# Patient Record
Sex: Female | Born: 1970 | Race: Black or African American | Hispanic: No | Marital: Single | Smoking: Current every day smoker
Health system: Southern US, Community
[De-identification: ages and names within clinical notes are randomized; demographics above are authoritative.]

## PROBLEM LIST (undated history)

## (undated) DIAGNOSIS — E039 Hypothyroidism, unspecified: Secondary | ICD-10-CM

## (undated) DIAGNOSIS — IMO0001 Reserved for inherently not codable concepts without codable children: Secondary | ICD-10-CM

## (undated) HISTORY — PX: UTERINE FIBROID SURGERY: SHX826

---

## 2000-04-22 ENCOUNTER — Emergency Department (HOSPITAL_COMMUNITY): Admission: EM | Admit: 2000-04-22 | Discharge: 2000-04-23 | Payer: Self-pay | Admitting: Emergency Medicine

## 2000-07-08 ENCOUNTER — Emergency Department (HOSPITAL_COMMUNITY): Admission: EM | Admit: 2000-07-08 | Discharge: 2000-07-08 | Payer: Self-pay | Admitting: Emergency Medicine

## 2001-03-31 ENCOUNTER — Emergency Department (HOSPITAL_COMMUNITY): Admission: EM | Admit: 2001-03-31 | Discharge: 2001-03-31 | Payer: Self-pay | Admitting: *Deleted

## 2001-05-13 ENCOUNTER — Encounter: Payer: Self-pay | Admitting: Obstetrics & Gynecology

## 2001-05-13 ENCOUNTER — Inpatient Hospital Stay (HOSPITAL_COMMUNITY): Admission: EM | Admit: 2001-05-13 | Discharge: 2001-05-16 | Payer: Self-pay | Admitting: Emergency Medicine

## 2001-05-14 ENCOUNTER — Encounter: Payer: Self-pay | Admitting: Gastroenterology

## 2006-11-23 ENCOUNTER — Emergency Department (HOSPITAL_COMMUNITY): Admission: EM | Admit: 2006-11-23 | Discharge: 2006-11-23 | Payer: Self-pay | Admitting: Emergency Medicine

## 2006-11-25 ENCOUNTER — Inpatient Hospital Stay (HOSPITAL_COMMUNITY): Admission: AD | Admit: 2006-11-25 | Discharge: 2006-11-25 | Payer: Self-pay | Admitting: Obstetrics & Gynecology

## 2006-12-10 ENCOUNTER — Ambulatory Visit: Payer: Self-pay | Admitting: Obstetrics & Gynecology

## 2006-12-23 ENCOUNTER — Encounter (INDEPENDENT_AMBULATORY_CARE_PROVIDER_SITE_OTHER): Payer: Self-pay | Admitting: Gynecology

## 2006-12-23 ENCOUNTER — Ambulatory Visit: Payer: Self-pay | Admitting: Gynecology

## 2006-12-29 ENCOUNTER — Ambulatory Visit (HOSPITAL_COMMUNITY): Admission: RE | Admit: 2006-12-29 | Discharge: 2006-12-30 | Payer: Self-pay | Admitting: Gynecology

## 2006-12-29 ENCOUNTER — Encounter (INDEPENDENT_AMBULATORY_CARE_PROVIDER_SITE_OTHER): Payer: Self-pay | Admitting: Gynecology

## 2006-12-29 ENCOUNTER — Ambulatory Visit: Payer: Self-pay | Admitting: Gynecology

## 2007-01-27 ENCOUNTER — Ambulatory Visit: Payer: Self-pay | Admitting: Gynecology

## 2008-08-20 ENCOUNTER — Emergency Department (HOSPITAL_COMMUNITY): Admission: EM | Admit: 2008-08-20 | Discharge: 2008-08-20 | Payer: Self-pay | Admitting: Emergency Medicine

## 2009-08-22 ENCOUNTER — Emergency Department (HOSPITAL_COMMUNITY): Admission: EM | Admit: 2009-08-22 | Discharge: 2009-08-22 | Payer: Self-pay | Admitting: Emergency Medicine

## 2010-10-01 NOTE — Discharge Summary (Signed)
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   IN-HOSPITAL PROCEDURES:  Total vaginal hysterectomy, left salpingo-  oophorectomy with preservation of right tube and ovary.   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   HOSPITAL COURSE:  This patient is a 40 year old African American  multiparous female who underwent the aforementioned procedure on December 29, 2006. During the course of surgery was noted that the leiomyoma in  question which was approximately 7-8 cm was degenerated and demonstrated  material consistent with necrosis. Frozen section was unable to  demonstrate decisively whether the patient had a sarcoma or not.  The  entire uterus was submitted for frozen section. The left tube and ovary  were removed due to undue adherence to the left uterine sidewall. It was  determined to wait for final pathology report before proceeding with any  further management.   Postoperative hemoglobin was 9.9 from 11.3 preoperative, creatinine 0.6.  She was afebrile.  She had scant vaginal flow.  Abdomen: Soft.  Lungs:  Clear.  Calves without tenderness.   The patient was discharged with routine postoperative instructions  including contacting the office for temperature elevation above 100.4  degrees Fahrenheit, increasing abdominal pain, vaginal bleeding,  genitourinary or gastrointestinal symptomatology.  The patient was  requested to return to the GYN clinic in 4 weeks and Dilaudid 2 mg one  every 4-6 hours prescribed total number 30 for analgesia.   All questions  answered to the satisfaction of said patient and the patient verbalized  understanding of same.      Ginger Carne, MD  Electronically Signed     SHB/MEDQ  D:  12/30/2006  T:  12/30/2006  Job:  [id number]

## 2010-10-01 NOTE — H&P (Signed)
NAMENASHIYA, DISBROW            ACCOUNT NO.:  [id number]   MEDICAL RECORD NO.:  [id number]          PATIENT TYPE:  AMB   LOCATION:  SDC                           [facility]ACILITY:  [facility]   PHYSICIAN:  Ginger Carne, MD  DATE OF BIRTH:  March 14, 1971   DATE OF ADMISSION:  12/29/2006  DATE OF DISCHARGE:                              HISTORY & PHYSICAL   REASON FOR ADMISSION:  12 to 14-week leiomyomatous uterus.   HISTORY OF PRESENT ILLNESS:  This patient is a 40 year old gravida 6,  para 3-0-3-3 female with a 12 to 14-week leiomyomatous uterus on  physical examination and pelvic sonogram complaining of pelvic pain and  pressure.  Her menses are regular about every 28 days lasting for only 3-  4 days.  She has spotting between her menses but otherwise no specific  complaints.  The patient states the discomfort has worsened over the  past year.  The patient had no genitourinary, musculoskeletal or  gastrointestinal sources for her discomfort.  Transvaginal sonogram  obtained at the [facility] of [place] on November 25, 2006  demonstrates a retroverted uterus with a central submucosal fibroid  measuring 7 cm which has areas of degeneration.   OB/GYN HISTORY:  She has had three voluntary terminations of pregnancy  and three normal vaginal deliveries in the past.  Method of  contraception is tubal ligation.   ALLERGIES:  None.   CURRENT MEDICATIONS:  None.   PAST SURGICAL HISTORY:  None.   MEDICAL HISTORY:  None.   SOCIAL HISTORY:  The patient smokes about one pack of cigarettes daily.  Denies illicit alcohol or drug abuse.   REVIEW OF SYSTEMS:  14 point comprehensive review of systems within  normal limits.   FAMILY HISTORY:  Is negative for first-degree relatives with breast,  ovarian or uterine carcinoma.   PHYSICAL EXAMINATION:  VITAL SIGNS:  Blood pressure 96/67, weight 106  pounds, pulse 66 and regular, temperature 97.6.  HEENT: Grossly normal.  BREASTS:  Without masses,  discharge, thickenings or tenderness.  CHEST:  Clear to percussion and auscultation.  CARDIOVASCULAR:  Without murmur or enlargement.  Regular rate and  rhythm.  EXTREMITIES:  LYMPHATIC:  SKIN:  NEUROLOGICAL:  MUSCULOSKELETAL:  Systems within normal limits.  ABDOMEN:  Soft without gross hepatosplenomegaly, palpable suprapubic  mass consistent with said fibroid noted.  PELVIC:  Normal Pap smear.  External Genitalia: Vulva and vagina normal.  Cervix smooth without erosions or lesions.  Uterus is 14 weeks in size,  globular with a posterior palpated fibroid and retroverted and flexed.  Both adnexa palpable found to be normal.  Rectovaginal exam  confirmatory.   IMPRESSION:  Symptomatic leiomyomatous uterus.   PLAN:  The patient has no desire for further childbearing. Other options  including an uterine artery embolization were discussed and declined by  the patient as well as a myomectomy. Total vaginal hysterectomy with  preservation of both tubes or ovaries were discussed and understood and  the nature of said surgery understood by said patient.  Risks including  possible injuries to ureter, bowel, bladder, possible conversion to an  open procedure or  laparoscopic procedure, hemorrhage possibly requiring  blood transfusion, infection and other unforeseen complications were  discussed and understood by said patient.      Ginger Carne, MD  Electronically Signed     SHB/MEDQ  D:  12/28/2006  T:  12/28/2006  Job:  [id number]

## 2010-10-01 NOTE — Op Note (Signed)
NAMEADER, FRITZE            ACCOUNT NO.:  [id number]   MEDICAL RECORD NO.:  [id number]          PATIENT TYPE:  OIB   LOCATION:  9318                          FACILITY:  [facility]   PHYSICIAN:  Ginger Carne, MD  DATE OF BIRTH:  [birth year]   DATE OF PROCEDURE:  12/29/2006  DATE OF DISCHARGE:                               OPERATIVE REPORT   PREOPERATIVE DIAGNOSIS:  Degenerating leiomyoma, 12-14 weeks  leiomyomatous uterus.   POSTOPERATIVE DIAGNOSIS:  Degenerating leiomyoma, 12-14 weeks  leiomyomatous uterus.   PROCEDURE:  Total vaginal hysterectomy, left salpingo-oophorectomy with  preservation of right tube and ovary.   SURGEON:  Ginger Carne, M.D.   ASSISTANT:  Dr. Okey Dupre.   ESTIMATED BLOOD LOSS:  150 mL.   ANESTHESIA:  General.   COMPLICATIONS:  None immediate.   OPERATIVE FINDINGS:  Uterus was about 12-14 weeks in size. Upon wedging  the uterus it was noted that necrotic material was present.  The cervix  and the uterus were sent for frozen section and the pathologist was  unable to determine malignancy.  Therefore the patient was not subjected  to a laparotomy. The left tube and ovary were removed due to the  adherence to the lateral portion of the uterus without a adequately  developed utero-ovarian ligament.  The right tube and ovary appeared  normal.   OPERATIVE PROCEDURE:  The patient prepped and draped in usual fashion  and placed in lithotomy position.  Betadine solution used for antiseptic  and the patient was catheterized prior to procedure.  After adequate  general anesthesia, double tooth tenaculum placed on the anterior  posterior lips of the cervix.  Following this, Marcaine with epinephrine  was injected circumferentially around the cervix.  The anterior  posterior vaginal epithelium were incised transversely.  Peritoneal  reflections identified and opened without injury to their respective  organs.  Uterosacral cardinal ligament complexes clamped  and ligated  with 0 Vicryl suture and affixed to their respective lateral vaginal  walls. In the standard Richardson fashion uterine vasculature clamped  and ligated 0 Vicryl suture.  This extended to the ascending uterine  branches of the broad ligaments.  The right utero-ovarian ligament and  the left  infundibulopelvic ligaments were doubly clamped, cut and ligated 0  Vicryl suture.  Bleeding points hemostatically checked.  Blood clots  removed.  Closure of the cuff with 0 Vicryl running interlocking suture.  The patient tolerated the procedure well, returned to the post  anesthesia recovery room in excellent condition.      Ginger Carne, MD  Electronically Signed     SHB/MEDQ  D:  12/29/2006  T:  12/29/2006  Job:  [id number]

## 2010-10-04 NOTE — Discharge Summary (Signed)
[facility]  Patient:    Melinda Davidson, Melinda Davidson Visit Number: [id number] MRN: [id number]          Service Type: MED Location: 3W 0342 01 Attending Physician:  Judeth Cornfield Dictated by:   Sammuel Cooper, P.A.C. Admit Date:  05/13/2001 Discharge Date: 05/16/2001   CC:         Timothy P. Fontaine, M.D.   Discharge Summary  CHIEF COMPLAINT:  Acute abdominal pain and diarrhea.  HISTORY OF PRESENT ILLNESS:  Melinda Davidson is a 40 year old African-American female who was admitted unassigned through the ER with complaints of acute abdominal pain and diarrhea, which have been present over the past 48 hours.  The patient described severe crampy diffuse abdominal discomfort without nausea, vomiting, fever, or chills.  She also developed nonbloody diarrhea.  On the day of admission, she had noted a small amount of vaginal bleeding.  Her last menstrual period was completed approximately five days ago.  She was seen and evaluated in the ER and noted to have a white count of 19,000.  A CT scan of the abdomen and pelvis was ordered, showing diffuse thickening of the terminal ileum, ascending and transverse colon, and cecum, as well as mesenteric stranding.  GI was called.  The patient is admitted at this time for further diagnostic evaluation.  The patient has no prior history of GI disease, but mentions that she may have had an ulcer several years ago.  CURRENT MEDICATIONS:  None on a regular basis.  ALLERGIES:  No known drug allergies.  PAST MEDICAL HISTORY:  Pertinent for: 1. Bilateral tubal ligation. 2. History of peptic ulcer disease at age 17.  FAMILY HISTORY:  Negative for GI disease, specifically colon cancer and inflammatory bowel disease.  SOCIAL HISTORY:  The patient is single and unemployed.  She has two small children at home.  She is a smoker of half of a pack per day.  She uses alcohol socially on the weekends.  She denies any illicit drug  use.  REVIEW OF SYSTEMS:  Cardiovascular:  Denies any chest pain or anginal symptoms.  Pulmonary:  Negative for cough, shortness of breath, or sputum production.  Genitourinary:  Negative for dysuria, urgency, or frequency.  GI: As outlined above.  PHYSICAL EXAMINATION:  Per Barbette Hair. Arlyce Dice, M.D.  GENERAL APPEARANCE:  The patient is a well-developed, ill-appearing, young, African-American female.  Alert and oriented x 3.  VITAL SIGNS:  Temperature 101 degrees, pulse 102, and blood pressure 76/51 on admission.  HEENT:  Unremarkable.  CARDIOVASCULAR:  Regular rate and rhythm with S1 and S2.  She is tachycardic. No murmurs, rubs, or gallops.  CHEST:  Clear to A&P.  ABDOMEN:  Rather diffuse.  Mild tenderness with minimal guarding.  No rebound. No palpable organomegaly.  RECTAL:  Exam was deferred and was not done at the time of admission.  EXTREMITIES:  Without clubbing, cyanosis, or edema.  LABORATORY DATA:  On admission, WBC 19,000.  The remainder of the CBC is not located in the chart at the time of this dictation.  Follow-up on May 14, 2001, showed a WBC of 13,000, a hemoglobin of 10.4, a hematocrit of 31.0, an MCV of 84, and platelets of 202.  A follow-up on May 15, 2001, the WBC was 10.9, hemoglobin 9.3, hematocrit 28.0, and platelets 188.  Sodium on admission of 132, potassium 3.3, BUN 10, creatinine 0.8, glucose 102, and albumin 3.1.  Liver function studies normal.  The amylase was 21.  Follow-up electrolytes on  May 16, 2001, showed a sodium of 137, potassium 3.4, BUN 1, and creatinine 0.7.  Serum iron low at 26, TIBC low at 213, iron saturation low at 12, B12 level greater than 2000, RBC folate 251, ferritin 111.  Blood cultures x 2 were negative.  Stool for wbc was none seen. Stool for Clostridium difficile negative.  Stool culture negative.  X-RAY STUDIES:  Pelvic ultrasound on May 13, 2001, showed a retroflexed uterus with a rounded ring-like  left of midline, question fibroid or adenomyoma.  There was echogenic free fluid.  A CT scan of the abdomen and pelvis showed as outlined above, significantly thickened wall of the ascending and transverse colon with surrounding mesenteric stranding consistent with a colitis.  The appendix was seen with contrast or appendicolith and minimal stranding, but normal thickness.  ______ films were negative.  HOSPITAL COURSE:  The patient was admitted to the service on Barbette Hair. Arlyce Dice, M.D., who was covering the hospital.  She was placed on IV fluids at 150 cc/hr, started on IV Solu-Medrol for the possibility of an acute presentation of inflammatory bowel disease, covered with IV Pepcid, started on Asacol 1200 mg t.i.d., and given Demerol and Phenergan as needed for pain and nausea.  Stool cultures were ordered.  The following morning after discussion with the patient, she had mentioned that she had taken an antibiotic for four to five days about three weeks ago for a tooth abscess and had not had any other prior GI symptoms until abrupt onset.  She was noted to be febrile to 101.8 degrees.  The white count had gone from 19,000 to 13,000.  We felt that her abrupt presentation was more consistent with an infectious etiology.  We added Flagyl to her regimen and continued IV Cipro.  The patient also related that she had eaten [place] food with lobster the same day she became acute ill.  The decision was made to hold the Solu-Medrol and monitor her.  On May 15, 2001, her temperature was down and she felt subjectively improved.  We continued the same regimen.  By May 16, 2001, she was feeling significantly better.  Her diarrhea was resolving.  She was having less abdominal discomfort and not requiring any IV medication for pain.  She was afebrile.  Her white count has normalized.  She did have an anemia, which was more consistent with chronic disease than pure iron deficiency.  She also was  found to have positive gonococcus from her pelvic exam.   DISPOSITION:  It was felt that she was stable for discharge to home with plans for outpatient follow-up.  DISCHARGE MEDICATIONS: 1. Cipro 250 mg b.i.d. x 7 days. 2. Flagyl 250 mg q.i.d. x 7 days. 3. Extra Strength Tylenol one every four hours if needed for pain. 4. Levsin sublingual one or two every four hours if needed for pain and    cramping. 5. Full liquids and advance to soft foods with minimal lactose over the next    one to two weeks.  FOLLOW-UP:  She was to call Timothy P. Fontaine, M.D., for a GYN appointment for follow-up of her positive GC and for routine follow-up.  She was to call Molly Maduro D. Arlyce Dice, M.D., for a follow-up appointment in two to three weeks. Depending on the resolution of her symptoms in the interim will decide on need for colonoscopy as an outpatient at the time of her next office visit. Dictated by:   Sammuel Cooper, P.A.C. Attending Physician:  Arlyce Dice,  Debbe Bales DD:  05/25/01 TD:  05/25/01 Job: [id number]

## 2010-10-04 NOTE — H&P (Signed)
[facility]  Patient:    Melinda Davidson, Melinda Davidson Visit Number: [id number] MRN: [id number]          Service Type: MED Location: 3W 0342 01 Attending Physician:  Judeth Cornfield Dictated by:   Mike Gip, P.A.C. Admit Date:  05/13/2001 Discharge Date: 05/16/2001                           History and Physical  CHIEF COMPLAINT:  Abdominal pain and diarrhea.  HISTORY OF PRESENT ILLNESS:  Melinda Davidson is a 40 year old African-American female who is admitted through the ER unassigned with acute presentation of abdominal pain and diarrhea.  The patient said she had developed severe crampy, rather diffuse abdominal pain approximately 48 hours prior to presentation.  She had no associated nausea, vomiting, fever, or chills.  She did not note any melena or hematochezia but has had rather perfuse diarrhea.  The patient had also noted a small amount of vaginal bleeding on the day of admission.  She was seen and evaluated in the ER initially at [facility] and noted to have a white count of 19,000.  CT scan of the abdomen and pelvis was done showing diffuse thickening of the terminal ileum, ascending, transverse colon, and cecum with mesenteric stranding.  The patient was then transferred to the [facility] Emergency Room for GI evaluation and admission.  She was seen by Dr. Arlyce Dice and admitted for further diagnostic evaluation.  CURRENT MEDICATIONS:  None regular.  ALLERGIES:  No known drug allergies.  PAST MEDICAL HISTORY: 1. Status post bilateral tubal ligation. 2. History of peptic ulcer disease, age 40.  FAMILY HISTORY:  Negative for GI disease.  SOCIAL HISTORY:  The patient is a single mother of two small children, unemployed.  She is a smoker of one-half pack per day.  Social ETOH, no drug use.  REVIEW OF SYSTEMS:  Cardiovascular: Negative for chest pain or anginal symptoms.  Pulmonary: Negative for cough, shortness of breath, or sputum production.   Genitourinary: Negative for dysuria, urgency, or frequency.  GI: As above.  PHYSICAL EXAMINATION: (Per Dr. Arlyce Dice)  GENERAL:   The patient is a well-developed, ill-appearing, young African-American female, alert and oriented x 3.  VITAL SIGNS:  Temperature 101, pulse 102, blood pressure 76/51 on admission.  HEENT:  Unremarkable, anicteric.  CHEST: Clear to auscultation and percussion.  CARDIOVASCULAR:  Tachycardic, regular rhythm with S1 and S2.  ABDOMEN:  Soft with rather diffuse tenderness, minimal guarding.  No rebound or organomegaly.  RECTAL:  Not done on admission.  LABORATORY DATA:  From December 26, WBC was 19,000.  I do not have that hemoglobin available on this chart.  Sodium 132, potassium 3.3, BUN 10, creatinine 0.8, glucose 102, albumin 3.1.  Liver function studies normal.  She did have pelvic exam which returned with positive GC.  IMPRESSION: 39. A 40 year old female with acute abdominal pain and diarrhea with CT    findings of an acute right-sided colitis, rule out underlying inflammatory    bowel disease, rule out infectious etiology.  Doubt appendicitis. 2. Status post bilateral tubal ligation. 3. History of peptic ulcer disease.  PLAN: The patient was admitted to the service of Dr. Melvia Heaps for IV fluid hydration, pain control.  She was started on IV Solu-Medrol, covered with IV Cipro, started on Asacol empirically for possibility of an inflammatory bowel disease.  Stool for culture and sensitivity, WBC, and C. difficile were ordered as well as O&P.  She was to be n.p.o. initially, and depending on her course, would plan colonoscopy.Dictated by:   Mike Gip, P.A.C. Attending Physician:  Judeth Cornfield DD:  05/25/01 TD:  05/25/01 Job: [id number]

## 2010-10-04 NOTE — Discharge Summary (Signed)
[facility]  Patient:    MELLISSA, CONLEY Visit Number: [id number] MRN: [id number]          Service Type: MED Location: 3W 0342 01 Attending Physician:  Judeth Cornfield Dictated by:   Sammuel Cooper, P.A.-C. Admit Date:  05/13/2001 Discharge Date: 05/16/2001                             Discharge Summary  ADDENDUM TO DISCHARGE SUMMARY (MISTAKENLY DICTATED AS AN H&P)  ADMITTING DIAGNOSES: 49. A 40 year old African-American female with acute abdominal pain and    diarrhea with CT findings consistent with colitis, rule out infectious    versus underlying inflammatory bowel disease. 2. Status post bilateral tubal ligation. 3. History of peptic ulcer disease, age 54. 4. Mild normocytic anemia.  DISCHARGE DIAGNOSES: 1. Resolving inflammatory process of the right colon, rule out infectious    etiology versus inflammatory bowel disease.  Rapid resolution most    consistent with an infectious etiology. 2. Mild normocytic anemia secondary to above. 3. Positive GC probe/asymptomatic.  CONSULTATIONS:  None.  PROCEDURES:  CT scan of the abdomen and pelvis and pelvic ultrasound. Dictated by:   Sammuel Cooper, P.A.-C. Attending Physician:  Judeth Cornfield DD:  05/25/01 TD:  05/25/01 Job: [id number]

## 2011-03-03 LAB — CBC
HCT: 29.9 — ABNORMAL LOW
HCT: 37.2
Hemoglobin: 12.6
Hemoglobin: 9.9 — ABNORMAL LOW
MCHC: 32.9
MCV: 86.3
MCV: 86.6
Platelets: 212
Platelets: 249
RDW: 14.6 — ABNORMAL HIGH
WBC: 6.2

## 2011-03-03 LAB — BASIC METABOLIC PANEL
BUN: 2 — ABNORMAL LOW
BUN: 9
CO2: 29
Chloride: 105
GFR calc non Af Amer: >60
GFR calc non Af Amer: >60
Glucose, Bld: 113 — ABNORMAL HIGH
Glucose, Bld: 95
Potassium: 3.2 — ABNORMAL LOW
Potassium: 3.8
Sodium: 132 — ABNORMAL LOW
Sodium: 139

## 2011-03-04 LAB — CBC
HCT: 33.3 — ABNORMAL LOW
HCT: 34.3 — ABNORMAL LOW
MCHC: 34
MCHC: 32.9
MCV: 86.5
MCV: 85.9
Platelets: 251
RBC: 3.99
WBC: 10.5
WBC: 8.5

## 2011-03-04 LAB — DIFFERENTIAL
Basophils Relative: 0
Eosinophils Absolute: 0.1
Eosinophils Relative: 1
Lymphs Abs: 2.4
Monocytes Relative: 9
Neutrophils Relative %: 67

## 2011-03-04 LAB — URINALYSIS, ROUTINE W REFLEX MICROSCOPIC
Bilirubin Urine: NEGATIVE
Hgb urine dipstick: NEGATIVE
Ketones, ur: >80 — AB
Nitrite: NEGATIVE
Protein, ur: NEGATIVE
Specific Gravity, Urine: 1.026
Urobilinogen, UA: 1

## 2011-03-04 LAB — URINE MICROSCOPIC-ADD ON

## 2011-03-04 LAB — I-STAT 8, (EC8 V) (CONVERTED LAB)
BUN: 9
Bicarbonate: 25.3 — ABNORMAL HIGH
Glucose, Bld: 89
Operator id: 189501
TCO2: 27
pCO2, Ven: 42.1 — ABNORMAL LOW

## 2011-03-04 LAB — GC/CHLAMYDIA PROBE AMP, GENITAL
Chlamydia, DNA Probe: NEGATIVE
GC Probe Amp, Genital: NEGATIVE

## 2011-03-04 LAB — POCT PREGNANCY, URINE
Operator id: 189501
Preg Test, Ur: NEGATIVE

## 2011-03-04 LAB — WET PREP, GENITAL

## 2011-08-16 ENCOUNTER — Emergency Department (HOSPITAL_COMMUNITY)
Admission: EM | Admit: 2011-08-16 | Discharge: 2011-08-16 | Disposition: A | Discharge status: Home / Self Care | Payer: Self-pay | Attending: Emergency Medicine | Admitting: Emergency Medicine

## 2011-08-16 ENCOUNTER — Encounter (HOSPITAL_COMMUNITY): Payer: Self-pay | Admitting: Emergency Medicine

## 2011-08-16 ENCOUNTER — Emergency Department (HOSPITAL_COMMUNITY): Payer: Self-pay

## 2011-08-16 DIAGNOSIS — F17200 Nicotine dependence, unspecified, uncomplicated: Secondary | ICD-10-CM | POA: Insufficient documentation

## 2011-08-16 DIAGNOSIS — IMO0001 Reserved for inherently not codable concepts without codable children: Secondary | ICD-10-CM | POA: Insufficient documentation

## 2011-08-16 DIAGNOSIS — R51 Headache: Secondary | ICD-10-CM | POA: Insufficient documentation

## 2011-08-16 HISTORY — DX: Reserved for inherently not codable concepts without codable children: IMO0001

## 2011-08-16 MED ORDER — KETOROLAC TROMETHAMINE 30 MG/ML IJ SOLN
60.0000 mg | Freq: Once | INTRAMUSCULAR | Status: AC
  Administered 2011-08-16: 30 mg via INTRAMUSCULAR
  Filled 2011-08-16: qty 1

## 2011-08-16 MED ORDER — METOCLOPRAMIDE HCL 5 MG/ML IJ SOLN
10.0000 mg | Freq: Once | INTRAMUSCULAR | Status: AC
  Administered 2011-08-16: 10 mg via INTRAMUSCULAR
  Filled 2011-08-16: qty 2

## 2011-08-16 MED ORDER — DIPHENHYDRAMINE HCL 25 MG PO CAPS
25.0000 mg | ORAL_CAPSULE | Freq: Once | ORAL | Status: AC
  Administered 2011-08-16: 25 mg via ORAL
  Filled 2011-08-16: qty 1

## 2011-08-16 NOTE — ED Provider Notes (Signed)
History     CSN: [id number]  Arrival date & time 08/16/11  4098   First MD Initiated Contact with Patient 08/16/11 [phone]      Chief Complaint  Patient presents with  . Headache    (Consider location/radiation/quality/duration/timing/severity/associated sxs/prior treatment) HPI  Patient presents to emergency department complaining of a one-week history of intermittent diffuse headache followed by a one-week history of more constant left-sided headache. Patient states that she is a multiple year history of some recurrent headaches stating that she'll have a mild headache 2-3 times out of every month that she treats at home with over-the-counter pain medication with relief of symptoms. Patient states that starting 2 weeks ago she had intermittent diffuse headache that she treated with over-the-counter pain medication with good relief of symptoms. She denies associated fevers, chills, visual changes, neck stiffness, dizziness, difficulty ambulating. However over the last week patient states pain is become more unilateral with pain behind her left eye and in the left side of her head. Patient denies any pain within her actual eye and denies any visual changes. Patient denies associated photophobia or phonophobia. She denies blurred vision or visual changes. Patient states that she has been taking BC powders this week with some temporary improvement in pain but in pain will recur. Patient states that she wanted to be evaluated due to the increased frequency of headache being different than her history of more sporadic headaches. Patient states she's never been evaluated for headache in the past. She has no known medical problems and takes no medicines on regular basis. Once again she denies fevers, chills, dizziness, visual changes, neck stiffness, difficulty ambulating or loss of coordination, earache, nasal congestion, sinus pressure. Patient states pain is aggravated by movement stating that "if I drop  my head forward it makes the pain worse but if I raise up straight the pain improves."  Past Medical History  Diagnosis Date  . No significant past medical history     Past Surgical History  Procedure Date  . Uterine fibroid surgery     History reviewed. No pertinent family history.  History  Substance Use Topics  . Smoking status: Current Everyday Smoker -- 1.0 packs/day  . Smokeless tobacco: Not on file  . Alcohol Use: No    OB History    Grav Para Term Preterm Abortions TAB SAB Ect Mult Living                  Review of Systems  All other systems reviewed and are negative.    Allergies  Review of patient's allergies indicates no known allergies.  Home Medications  No current outpatient prescriptions on file.  BP 107/59  Pulse 78  Temp(Src) 97.9 F (36.6 C) (Oral)  Resp 20  SpO2 100%  Physical Exam  Nursing note and vitals reviewed. Constitutional: She is oriented to person, place, and time. She appears well-developed and well-nourished. No distress.  HENT:  Head: Normocephalic and atraumatic.  Right Ear: External ear normal.  Left Ear: External ear normal.  Nose: Nose normal.       No TTP of sinuses  Eyes: Conjunctivae and EOM are normal. Pupils are equal, round, and reactive to light.  Neck: Normal range of motion. Neck supple. No Brudzinski's sign and no Kernig's sign noted.  Cardiovascular: Normal rate, regular rhythm, normal heart sounds and intact distal pulses.  Exam reveals no gallop and no friction rub.   No murmur heard. Pulmonary/Chest: Effort normal and breath sounds normal. No  respiratory distress. She has no wheezes. She has no rales. She exhibits no tenderness.  Abdominal: Bowel sounds are normal. She exhibits no distension and no mass. There is no tenderness. There is no rebound and no guarding.  Musculoskeletal: Normal range of motion. She exhibits no edema and no tenderness.  Neurological: She is alert and oriented to person, place, and  time. No cranial nerve deficit. Coordination normal.  Skin: Skin is warm and dry. No rash noted. She is not diaphoretic. No erythema.  Psychiatric: She has a normal mood and affect.    ED Course  Procedures (including critical care time)  IM reglan and toradol. PO benadryl  Labs Reviewed - No data to display Ct Head Wo Contrast  08/16/2011  *RADIOLOGY REPORT*  Clinical Data: Headache  CT HEAD WITHOUT CONTRAST  Technique:  Contiguous axial images were obtained from the base of the skull through the vertex without contrast.  Comparison: None.  Findings: The ventricles are normal in size, shape, and position. There is no mass effect or midline shift.  No acute hemorrhage or abnormal extra-axial fluid collections are identified.  The gray/white differentiation is normal.  The orbits, calvarium, visualized paranasal sinuses have a normal appearance.  IMPRESSION: Normal CT scan of the head with no evidence of acute intracranial abnormality.  Original Report Authenticated By: Brandon Melnick, M.D.     1. Headache       MDM  Patient has multiple year history of recurrent headache with no acute findings on CT scan. She's afebrile nontoxic-appearing. No neurofocal findings. No ataxia. Patient ambulated without difficulty. No loss of coordination. Patient states pain is behind left eye but denies any eye pain or pressure and has no visual changes. Signs and symptoms are not consistent with sinusitis with her denying nasal congestion or tenderness to her sinus region. At this time we'll conservatively treat patient for headache and have her followup with neurologist for further evaluation and management of recurrent headaches however spoke at length with patient about changing or worsening of symptoms that would prompt return to ER. She voices her understanding and is agreeable plan.        Lenon Oms [place], [place] 08/16/11 [phone]

## 2011-08-16 NOTE — Discharge Instructions (Signed)
Continue to take some form of over the counter headache medication immediately at onset of headache. May alternate between acetaminophen type products and NSAIDs products such as ibuprofen/motrin/advil for pain relief. Follow up with Neurology in 1-2 weeks to discuss further evaluation and management of recurrent headaches but return to ER for changing or worsening of symptoms.   Headaches, Frequently Asked Questions MIGRAINE HEADACHES Q: What is migraine? What causes it? How can I treat it? A: Generally, migraine headaches begin as a dull ache. Then they develop into a constant, throbbing, and pulsating pain. You may experience pain at the temples. You may experience pain at the front or back of one or both sides of the head. The pain is usually accompanied by a combination of:  Nausea.   Vomiting.   Sensitivity to light and noise.  Some people (about 15%) experience an aura (see below) before an attack. The cause of migraine is believed to be chemical reactions in the brain. Treatment for migraine may include over-the-counter or prescription medications. It may also include self-help techniques. These include relaxation training and biofeedback.  Q: What is an aura? A: About 15% of people with migraine get an "aura". This is a sign of neurological symptoms that occur before a migraine headache. You may see wavy or jagged lines, dots, or flashing lights. You might experience tunnel vision or blind spots in one or both eyes. The aura can include visual or auditory hallucinations (something imagined). It may include disruptions in smell (such as strange odors), taste or touch. Other symptoms include:  Numbness.   A "pins and needles" sensation.   Difficulty in recalling or speaking the correct word.  These neurological events may last as long as 60 minutes. These symptoms will fade as the headache begins. Q: What is a trigger? A: Certain physical or environmental factors can lead to or  "trigger" a migraine. These include:  Foods.   Hormonal changes.   Weather.   Stress.  It is important to remember that triggers are different for everyone. To help prevent migraine attacks, you need to figure out which triggers affect you. Keep a headache diary. This is a good way to track triggers. The diary will help you talk to your healthcare professional about your condition. Q: Does weather affect migraines? A: Bright sunshine, hot, humid conditions, and drastic changes in barometric pressure may lead to, or "trigger," a migraine attack in some people. But studies have shown that weather does not act as a trigger for everyone with migraines. Q: What is the link between migraine and hormones? A: Hormones start and regulate many of your body's functions. Hormones keep your body in balance within a constantly changing environment. The levels of hormones in your body are unbalanced at times. Examples are during menstruation, pregnancy, or menopause. That can lead to a migraine attack. In fact, about three quarters of all women with migraine report that their attacks are related to the menstrual cycle.  Q: Is there an increased risk of stroke for migraine sufferers? A: The likelihood of a migraine attack causing a stroke is very remote. That is not to say that migraine sufferers cannot have a stroke associated with their migraines. In persons under age 41, the most common associated factor for stroke is migraine headache. But over the course of a person's normal life span, the occurrence of migraine headache may actually be associated with a reduced risk of dying from cerebrovascular disease due to stroke.  Q: What are acute  medications for migraine? A: Acute medications are used to treat the pain of the headache after it has started. Examples over-the-counter medications, NSAIDs, ergots, and triptans.  Q: What are the triptans? A: Triptans are the newest class of abortive medications. They are  specifically targeted to treat migraine. Triptans are vasoconstrictors. They moderate some chemical reactions in the brain. The triptans work on receptors in your brain. Triptans help to restore the balance of a neurotransmitter called serotonin. Fluctuations in levels of serotonin are thought to be a main cause of migraine.  Q: Are over-the-counter medications for migraine effective? A: Over-the-counter, or "OTC," medications may be effective in relieving mild to moderate pain and associated symptoms of migraine. But you should see your caregiver before beginning any treatment regimen for migraine.  Q: What are preventive medications for migraine? A: Preventive medications for migraine are sometimes referred to as "prophylactic" treatments. They are used to reduce the frequency, severity, and length of migraine attacks. Examples of preventive medications include antiepileptic medications, antidepressants, beta-blockers, calcium channel blockers, and NSAIDs (nonsteroidal anti-inflammatory drugs). Q: Why are anticonvulsants used to treat migraine? A: During the past few years, there has been an increased interest in antiepileptic drugs for the prevention of migraine. They are sometimes referred to as "anticonvulsants". Both epilepsy and migraine may be caused by similar reactions in the brain.  Q: Why are antidepressants used to treat migraine? A: Antidepressants are typically used to treat people with depression. They may reduce migraine frequency by regulating chemical levels, such as serotonin, in the brain.  Q: What alternative therapies are used to treat migraine? A: The term "alternative therapies" is often used to describe treatments considered outside the scope of conventional Western medicine. Examples of alternative therapy include acupuncture, acupressure, and yoga. Another common alternative treatment is herbal therapy. Some herbs are believed to relieve headache pain. Always discuss alternative  therapies with your caregiver before proceeding. Some herbal products contain arsenic and other toxins. TENSION HEADACHES Q: What is a tension-type headache? What causes it? How can I treat it? A: Tension-type headaches occur randomly. They are often the result of temporary stress, anxiety, fatigue, or anger. Symptoms include soreness in your temples, a tightening band-like sensation around your head (a "vice-like" ache). Symptoms can also include a pulling feeling, pressure sensations, and contracting head and neck muscles. The headache begins in your forehead, temples, or the back of your head and neck. Treatment for tension-type headache may include over-the-counter or prescription medications. Treatment may also include self-help techniques such as relaxation training and biofeedback. CLUSTER HEADACHES Q: What is a cluster headache? What causes it? How can I treat it? A: Cluster headache gets its name because the attacks come in groups. The pain arrives with little, if any, warning. It is usually on one side of the head. A tearing or bloodshot eye and a runny nose on the same side of the headache may also accompany the pain. Cluster headaches are believed to be caused by chemical reactions in the brain. They have been described as the most severe and intense of any headache type. Treatment for cluster headache includes prescription medication and oxygen. SINUS HEADACHES Q: What is a sinus headache? What causes it? How can I treat it? A: When a cavity in the bones of the face and skull (a sinus) becomes inflamed, the inflammation will cause localized pain. This condition is usually the result of an allergic reaction, a tumor, or an infection. If your headache is caused by a sinus  blockage, such as an infection, you will probably have a fever. An x-ray will confirm a sinus blockage. Your caregiver's treatment might include antibiotics for the infection, as well as antihistamines or decongestants.    REBOUND HEADACHES Q: What is a rebound headache? What causes it? How can I treat it? A: A pattern of taking acute headache medications too often can lead to a condition known as "rebound headache." A pattern of taking too much headache medication includes taking it more than 2 days per week or in excessive amounts. That means more than the label or a caregiver advises. With rebound headaches, your medications not only stop relieving pain, they actually begin to cause headaches. Doctors treat rebound headache by tapering the medication that is being overused. Sometimes your caregiver will gradually substitute a different type of treatment or medication. Stopping may be a challenge. Regularly overusing a medication increases the potential for serious side effects. Consult a caregiver if you regularly use headache medications more than 2 days per week or more than the label advises. ADDITIONAL QUESTIONS AND ANSWERS Q: What is biofeedback? A: Biofeedback is a self-help treatment. Biofeedback uses special equipment to monitor your body's involuntary physical responses. Biofeedback monitors:  Breathing.   Pulse.   Heart rate.   Temperature.   Muscle tension.   Brain activity.  Biofeedback helps you refine and perfect your relaxation exercises. You learn to control the physical responses that are related to stress. Once the technique has been mastered, you do not need the equipment any more. Q: Are headaches hereditary? A: Four out of five (80%) of people that suffer report a family history of migraine. Scientists are not sure if this is genetic or a family predisposition. Despite the uncertainty, a child has a 50% chance of having migraine if one parent suffers. The child has a 75% chance if both parents suffer.  Q: Can children get headaches? A: By the time they reach high school, most young people have experienced some type of headache. Many safe and effective approaches or medications can  prevent a headache from occurring or stop it after it has begun.  Q: What type of doctor should I see to diagnose and treat my headache? A: Start with your primary caregiver. Discuss his or her experience and approach to headaches. Discuss methods of classification, diagnosis, and treatment. Your caregiver may decide to recommend you to a headache specialist, depending upon your symptoms or other physical conditions. Having diabetes, allergies, etc., may require a more comprehensive and inclusive approach to your headache. The National Head[employer] Foundation will provide, upon request, a list of [facility] physician members in your state. Document Released: 07/26/2003 Document Revised: 04/24/2011 Document Reviewed: 01/03/2008 [facility] Patient Information 2012 [place], [place].

## 2011-08-16 NOTE — ED Notes (Signed)
Patient complaining of headache on her left side around here eye; describes pain as "pressure".  Patient states that she gets headaches on and off over the past few years, but that this current headache has been constant for the past week.  Patient states that she has been taking BC's, which will bring the pain down, but that it will come back after the medication wears off.  Patient denies blurred vision and light sensitivity.

## 2011-08-17 NOTE — ED Provider Notes (Signed)
Medical screening examination/treatment/procedure(s) were performed by non-physician practitioner and as supervising physician I was immediately available for consultation/collaboration.  Toy Baker, MD 08/17/11 [phone]

## 2011-10-08 ENCOUNTER — Encounter (HOSPITAL_COMMUNITY): Payer: Self-pay | Admitting: Emergency Medicine

## 2011-10-08 ENCOUNTER — Emergency Department (HOSPITAL_COMMUNITY)
Admission: EM | Admit: 2011-10-08 | Discharge: 2011-10-08 | Disposition: A | Discharge status: Home / Self Care | Payer: Self-pay | Attending: Emergency Medicine | Admitting: Emergency Medicine

## 2011-10-08 DIAGNOSIS — L723 Sebaceous cyst: Secondary | ICD-10-CM | POA: Insufficient documentation

## 2011-10-08 DIAGNOSIS — L089 Local infection of the skin and subcutaneous tissue, unspecified: Secondary | ICD-10-CM

## 2011-10-08 NOTE — ED Notes (Signed)
PA-C in room at this time  

## 2011-10-08 NOTE — ED Provider Notes (Signed)
Medical screening examination/treatment/procedure(s) were performed by non-physician practitioner and as supervising physician I was immediately available for consultation/collaboration.   Benny Lennert, MD 10/08/11 [phone]

## 2011-10-08 NOTE — ED Notes (Signed)
Pt c/o pain under right arm in axillary area, Red swollen area size of dime. Pt states it has been swollen for a "couple of months" but in the last week has started to hurt.

## 2011-10-08 NOTE — ED Notes (Signed)
D/C inst given, pt verbalized understanding. NAD noted at time of d/c home.

## 2011-10-08 NOTE — ED Provider Notes (Signed)
History     CSN: [id number]  Arrival date & time 10/08/11  4098   First MD Initiated Contact with Patient 10/08/11 [phone]      Chief Complaint  Patient presents with  . Arm Pain    (Consider location/radiation/quality/duration/timing/severity/associated sxs/prior treatment) HPI  Patient presents to emergency department complaining of a couple month history of a "bump" in right axilla region. Our patient states that over the last week the area has become red and painful to touch. Patient denies any radiation of erythema, fevers, chills, drainage from lesion. She has taken nothing for pain PTA. Pain aggravated by touch and movement of arm at axilla. Improved with keeping arm still.   Past Medical History  Diagnosis Date  . No significant past medical history     Past Surgical History  Procedure Date  . Uterine fibroid surgery     No family history on file.  History  Substance Use Topics  . Smoking status: Current Everyday Smoker -- 1.0 packs/day  . Smokeless tobacco: Not on file  . Alcohol Use: No    OB History    Grav Para Term Preterm Abortions TAB SAB Ect Mult Living                  Review of Systems  All other systems reviewed and are negative.    Allergies  Review of patient's allergies indicates no known allergies.  Home Medications   Current Outpatient Rx  Name Route Sig Dispense Refill  . ACETAMINOPHEN 500 MG PO TABS Oral Take 1,000 mg by mouth every 6 (six) hours as needed. For pain      BP 102/69  Pulse 75  Temp(Src) 98.2 F (36.8 C) (Oral)  Resp 14  SpO2 98%  Physical Exam  Nursing note and vitals reviewed. Constitutional: She is oriented to person, place, and time. She appears well-developed and well-nourished. No distress.  HENT:  Head: Normocephalic and atraumatic.  Eyes: Conjunctivae are normal.  Cardiovascular: Normal rate, regular rhythm, normal heart sounds and intact distal pulses.  Exam reveals no gallop and no friction rub.     No murmur heard. Pulmonary/Chest: Effort normal and breath sounds normal. No respiratory distress. She has no wheezes. She has no rales. She exhibits no tenderness.  Musculoskeletal: Normal range of motion. She exhibits no edema and no tenderness.  Neurological: She is alert and oriented to person, place, and time.  Skin: Skin is warm and dry. No rash noted. She is not diaphoretic. There is erythema.       Pea sized area of induration with TTP with faint erythema.   Psychiatric: She has a normal mood and affect.    ED Course  Procedures (including critical care time)  INCISION AND DRAINAGE Performed by: Drucie Opitz Consent: Verbal consent obtained. Risks and benefits: risks, benefits and alternatives were discussed Type: abscess  Body area: right axilla.   Anesthesia: local infiltration  Local anesthetic: lidocaine 2% with epinephrine  Anesthetic total: 6 ml  Complexity: complex Blunt dissection to break up loculations  Drainage: purulent, cystic sold material   Drainage amount: scant   Patient tolerance: Patient tolerated the procedure well with no immediate complications.     Labs Reviewed - No data to display No results found.   1. Infected epithelial inclusion cyst       MDM  Infected epidermal cyst without cellulitis. No axillary LAD.         [place], [place] 10/08/11 [phone]

## 2011-10-08 NOTE — Discharge Instructions (Signed)
Keep area clean with mild soap and water and covered with band-aid. Monitor for infection. Alternate between tylenol and ibuprofen as needed for pain. Return to ER for emergent changing or worsening of symptoms. Otherwise establishment with a Primary Care provider is Very important for general health care concerns, minor illness and minor injury.   Epidermal Cyst An epidermal cyst is sometimes called a sebaceous cyst, epidermal inclusion cyst, or infundibular cyst. These cysts usually contain a substance that looks "pasty" or "cheesy" and may have a bad smell. This substance is a protein called keratin. Epidermal cysts are usually found on the face, neck, or trunk. They may also occur in the vaginal area or other parts of the genitalia of both men and women. Epidermal cysts are usually small, painless, slow-growing bumps or lumps that move freely under the skin. It is important not to try to pop them. This may cause an infection and lead to tenderness and swelling. CAUSES  Epidermal cysts may be caused by a deep penetrating injury to the skin or a plugged hair follicle, often associated with acne. SYMPTOMS  Epidermal cysts can become inflamed and cause:  Redness.   Tenderness.   Increased temperature of the skin over the bumps or lumps.   Grayish-white, bad smelling material that drains from the bump or lump.  DIAGNOSIS  Epidermal cysts are easily diagnosed by your caregiver during an exam. Rarely, a tissue sample (biopsy) may be taken to rule out other conditions that may resemble epidermal cysts. TREATMENT   Epidermal cysts often get better and disappear on their own. They are rarely ever cancerous.   If a cyst becomes infected, it may become inflamed and tender. This may require opening and draining the cyst. Treatment with antibiotics may be necessary. When the infection is gone, the cyst may be removed with minor surgery.   Small, inflamed cysts can often be treated with antibiotics or  by injecting steroid medicines.   Sometimes, epidermal cysts become large and bothersome. If this happens, surgical removal in your caregiver's office may be necessary.  HOME CARE INSTRUCTIONS  Only take over-the-counter or prescription medicines as directed by your caregiver.   Take your antibiotics as directed. Finish them even if you start to feel better.  SEEK MEDICAL CARE IF:   Your cyst becomes tender, red, or swollen.   Your condition is not improving or is getting worse.   You have any other questions or concerns.  MAKE SURE YOU:  Understand these instructions.   Will watch your condition.   Will get help right away if you are not doing well or get worse.  Document Released: 04/05/2004 Document Revised: 04/24/2011 Document Reviewed: 11/11/2010 [facility] Patient Information 2012 [place], [place].

## 2014-05-18 ENCOUNTER — Emergency Department (HOSPITAL_COMMUNITY): Payer: No Typology Code available for payment source

## 2014-05-18 ENCOUNTER — Encounter (HOSPITAL_COMMUNITY): Payer: Self-pay | Admitting: Emergency Medicine

## 2014-05-18 ENCOUNTER — Emergency Department (HOSPITAL_COMMUNITY)
Admission: EM | Admit: 2014-05-18 | Discharge: 2014-05-19 | Disposition: A | Discharge status: Home / Self Care | Payer: No Typology Code available for payment source | Attending: Emergency Medicine | Admitting: Emergency Medicine

## 2014-05-18 DIAGNOSIS — S8991XA Unspecified injury of right lower leg, initial encounter: Secondary | ICD-10-CM | POA: Insufficient documentation

## 2014-05-18 DIAGNOSIS — Y998 Other external cause status: Secondary | ICD-10-CM | POA: Insufficient documentation

## 2014-05-18 DIAGNOSIS — Y9389 Activity, other specified: Secondary | ICD-10-CM | POA: Insufficient documentation

## 2014-05-18 DIAGNOSIS — M25561 Pain in right knee: Secondary | ICD-10-CM

## 2014-05-18 DIAGNOSIS — S4991XA Unspecified injury of right shoulder and upper arm, initial encounter: Secondary | ICD-10-CM | POA: Diagnosis not present

## 2014-05-18 DIAGNOSIS — M25511 Pain in right shoulder: Secondary | ICD-10-CM

## 2014-05-18 DIAGNOSIS — Z720 Tobacco use: Secondary | ICD-10-CM | POA: Insufficient documentation

## 2014-05-18 DIAGNOSIS — Y92410 Unspecified street and highway as the place of occurrence of the external cause: Secondary | ICD-10-CM | POA: Insufficient documentation

## 2014-05-18 MED ORDER — OXYCODONE-ACETAMINOPHEN 5-325 MG PO TABS
1.0000 | ORAL_TABLET | Freq: Once | ORAL | Status: AC
  Administered 2014-05-18: 1 via ORAL
  Filled 2014-05-18: qty 1

## 2014-05-18 NOTE — ED Notes (Signed)
Patient transported to X-ray 
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
## 2014-05-18 NOTE — Discharge Instructions (Signed)
°Emergency Department Resource Guide °1) Find a Doctor and Pay Out of Pocket °Although you won't have to find out who is covered by your insurance plan, it is a good idea to ask around and get recommendations. You will then need to call the office and see if the doctor you have chosen will accept you as a new patient and what types of options they offer for patients who are self-pay. Some doctors offer discounts or will set up payment plans for their patients who do not have insurance, but you will need to ask so you aren't surprised when you get to your appointment. ° °2) Contact Your Local Health Department °Not all health departments have doctors that can see patients for sick visits, but many do, so it is worth a call to see if yours does. If you don't know where your local health department is, you can check in your phone book. The CDC also has a tool to help you locate your state's health department, and many state websites also have listings of all of their local health departments. ° °3) Find a Walk-in Clinic °If your illness is not likely to be very severe or complicated, you may want to try a walk in clinic. These are popping up all over the country in pharmacies, drugstores, and shopping centers. They're usually staffed by nurse practitioners or physician assistants that have been trained to treat common illnesses and complaints. They're usually fairly quick and inexpensive. However, if you have serious medical issues or chronic medical problems, these are probably not your best option. ° °No Primary Care Doctor: °- Call [facility] at  832-8000 - they can help you locate a primary care doctor that  accepts your insurance, provides certain services, etc. °- Physician Referral Service- 1-800-533-3463 ° °Chronic Pain Problems: °Organization         Address  Phone   Notes  °[facility]  (336) 297-2271 Patients need to be referred by their primary care doctor.  ° °Medication  Assistance: °Organization         Address  Phone   Notes  °[place] [facility] [address] °[place], [place] [address] (336) 641-8030 --Must be a resident of [place] °-- Must have NO insurance coverage whatsoever (no Medicaid/ Medicare, etc.) °-- The pt. MUST have a primary care doctor that directs their care regularly and follows them in the community °  °[facility]  (866) 331-1348   °[employer]  (888) 892-1162   ° °Agencies that provide inexpensive medical care: °Organization         Address  Phone   Notes  °[facility]  (336) 832-8035   °[facility]    (336) 832-7272   °[facility] [address] °[place],  [address] (336) 832-4777   °[facility] [address], °[place] (336) 271-4999   °[facility]    (336) 373-0678   °[facility]    (336) 272-1050   °[facility] ° [address], [place] Phone:  (336) 832-4444, Fax:  (336) 832-4440 Hours of Operation:  9 am - 6 pm, M-F.  Also accepts Medicaid/Medicare and self-pay.  °[facility] ° [address], [place] Phone: (336) 832-3150, Fax: (336) [phone]. Hours of Operation:  8:30 am - 5:30 pm, M-F.  Also accepts Medicaid and self-pay.  °[facility] [place] [address], [place] Phone: (336) 878-6027   °[facility] [address], [place], [place] (336)723-1848, Ext. 123 Mondays & Thursdays: 7-9 AM.  First 15 patients are seen on a first come, first serve basis. °  ° °Medicaid-accepting [place] Providers: ° °Organization         Address  Phone   Notes  °[facility] [address], [place] (336) 641-2100 Also accepts self-pay patients.  °[facility] [address], [place] ° (336) 856-9996   °[facility] [address], [place]  (336) 288-8857   °[facility] [address] [place] [address], [place] (336) 299-7000   °Veita Bland [address], [place]  ° (336) 373-1557 Only accepts [place] Access Medicaid patients after they have their name applied to their card.  ° °Self-Pay (no insurance) in [place]: ° °Organization         Address  Phone   Notes  °Sickle Cell Patients, [facility] [address], [place] (336) 832-1970   °[facility] [address], [place] (336) 832-4400   °[facility] ° [address], [place] (336) 992-4800   °[facility]/Dr. Osei-Bonsu ° [address] [place] [address], [place] or [address], [place] (336) 841-8500 Phone number for both [place] and [place] locations is the same.  °[facility] [address], [place] (336) 299-0000   °[facility] [address] [place] [address], [place] or [address] (336) 852-7530 °(336) 878-2260   °[facility] [address], [place] (336) 350-1642, phone; (336) 294-5005, fax Sees patients 1st and 3rd Saturday of every month.  Must not qualify for public or private insurance (i.e. Medicaid, Medicare, [place] Health Choice, Veterans' Benefits) • Household income should be no more than 200% of the poverty level •The clinic cannot treat you if you are pregnant or think you are pregnant • Sexually transmitted diseases are not treated at the clinic.  ° ° °Dental Care: °Organization         Address  Phone  Notes  °[place] [facility] [address], [place] (336) 641-6152 Accepts children up to age 21 who are enrolled in Medicaid or [place] Health Choice; pregnant women with a Medicaid card; and children who have applied for Medicaid or [place] Health Choice, but were declined, whose parents can pay a reduced fee at time of service.  °[place]  [facility] [place]  [address], [place] (336) 641-7733 Accepts children up to age 21 who are enrolled in Medicaid or [place] Health Choice; pregnant women with a Medicaid card; and children who have applied for Medicaid or  Health Choice, but were declined, whose parents can pay a reduced fee at time of service.  °[facility] ° [address],  (336) 641-4533 Patients are seen by appointment only. Walk-ins are not accepted. [facility] will see patients 18 years of age and older. °Monday - Tuesday (8am-5pm) °Most Wednesdays (8:30-5pm) °$30 per visit, cash only  °[facility] ° [address], [place] (336) 641-4533 Patients are seen by appointment only. Walk-ins are not accepted. [facility] will see patients 18 years of age and older. °One   Wednesday Evening (Monthly: Volunteer Based).  $30 per visit, cash only  °[facility]  (919) 537-3737 for adults; Children under age 4, call Graduate Pediatric Dentistry at (919) [phone]. Children aged 4-14, please call (919) [phone] to request a pediatric application. ° Dental services are provided in all areas of dental care including fillings, crowns and bridges, complete and partial dentures, implants, gum treatment, root canals, and extractions. Preventive care is also provided. Treatment is provided to both adults and children. °Patients are selected via a lottery and there is often a waiting list. °  °[facility] [address], °[place] ° (336) 763-8833 [email or web address] °  °[facility] [address], [place], [place] (336)723-1848, Ext. 123 Second and Fourth Thursday of each month, opens at 6:30 AM; Clinic ends at 9 AM.  Patients are seen on a first-come first-served basis, and a limited number are seen during each clinic.  ° °[facility] ° [address], [place], [place] (336) 723-7904    Eligibility Requirements °You must have lived in [place], [place], or [place] counties for at least the last three months. °  You cannot be eligible for state or federal sponsored healthcare insurance, including Veterans Administration, Medicaid, or Medicare. °  You generally cannot be eligible for healthcare insurance through your employer.  °  How to apply: °Eligibility screenings are held every Tuesday and Wednesday afternoon from 1:00 pm until 4:00 pm. You do not need an appointment for the interview!  °[facility] [address], [place], [place] 336-631-2330   °[facility]  336-342-8273   °[facility]  336-703-3100   °[facility]  336-570-6415   ° °Behavioral Health Resources in the Community: °Intensive Outpatient Programs °Organization         Address  Phone  Notes  °[facility] [address], [place], [place] 336-878-6098   °[facility] [address], [place], [place] 336-832-9800   °[facility] [address], [place], [place] ° 336-882-2125   °[facility] [address],  °[place], [place] 1-800-853-5163 or 336-641-4981   °Substance Abuse Resources °Organization         Address  Phone  Notes  °[facility]  336-882-2125   °[facility]  336-784-9470   °[facility]  336-285-9073   °[facility]  336-845-3988   °Residential & Outpatient Substance Abuse Program  1-800-659-3381   °Psychological Services °Organization         Address  Phone  Notes  °[facility]  336- 832-9600   °[facility]  336- 378-7881   °[facility] [address], [place] 1-800-853-5163 or 336-641-4981   ° °Mobile Crisis Teams °Organization         Address  Phone  Notes  °[facility], Mobile Crisis Care Unit  1-877-626-1772   °Assertive °[facility] ° [address]  [place], [place] 336-834-9664   °Sharon DeEsch [address] °[place] 336-554-5454   ° °Self-Help/Support Groups °Organization         Address  Phone             Notes  °[facility] [place] - variety of support groups  336- 373-1402 Call for more information  °Narcotics Anonymous (NA), [facility] [address], °[place]  2 meetings at this location  ° °  Residential Treatment Programs °Organization         Address  Phone  Notes  °[facility] [address],    °[place]  1-866-801-8205   °[facility] ° [address], [place], [place] 704-293-8524   °[facility] [address], [place] 336-845-3988 Admissions: 8am-3pm M-F  °[facility] [address]    °[place], [place] 336-841-1104   °[facility] [address], [place], [place] 336-379-7146   °[facility] [address]  °[place], [place] 336-285-9073   °[facility] - Intensive Outpatient [address], [place], [place] 336-852-3033   °[facility] [address]  °[place], [place] 1-877-615-2722 or 336-784-9470   °[facility] [address] [place], [place] 336-227-7417 Accepts Medicaid  °[facility] [address]  °[place] 1-800-659-3381 Substance Abuse/Addiction Treatment  ° °[place] Behavioral Health Resources °Organization         Address  Phone  Notes  °[facility]  (888) 581-9988   °Julie Brannon, PhD [address] [place], [place]   (336) 349-5553 or (336) 951-0000   °[facility]   [address] °[place], [place] (336) 349-4454   °[facility] [address], [place], [place] (336) 342-8316 Insurance/Medicaid/sponsorship through [facility]  °[facility] [address]                                    [place], [place] (336) 342-8316 Therapy/tele-psych/case    °[facility] [address]  ° [place], [place] (336) 349-2233    °Dr. Arfeen  (336) 349-4544   °[facility] [place]  [employer] [place] [facility] 1) [address], [place] °2) [address], [place] °3)  [address], [place] (336) 349-3220 °(336) 342-7768 ° °(336) 342-8140   °[place] Child Abuse [facility] (336) 342-1394 or (336) 342-3537 (After Hours)    ° ° °

## 2014-05-22 ENCOUNTER — Encounter (HOSPITAL_COMMUNITY): Payer: Self-pay | Admitting: Cardiology

## 2014-05-22 ENCOUNTER — Emergency Department (HOSPITAL_COMMUNITY)
Admission: EM | Admit: 2014-05-22 | Discharge: 2014-05-22 | Disposition: A | Discharge status: Home / Self Care | Payer: No Typology Code available for payment source | Attending: Emergency Medicine | Admitting: Emergency Medicine

## 2014-05-22 DIAGNOSIS — G8911 Acute pain due to trauma: Secondary | ICD-10-CM | POA: Insufficient documentation

## 2014-05-22 DIAGNOSIS — M545 Low back pain: Secondary | ICD-10-CM | POA: Diagnosis not present

## 2014-05-22 DIAGNOSIS — M25511 Pain in right shoulder: Secondary | ICD-10-CM | POA: Diagnosis present

## 2014-05-22 DIAGNOSIS — Z720 Tobacco use: Secondary | ICD-10-CM | POA: Insufficient documentation

## 2014-05-22 DIAGNOSIS — M542 Cervicalgia: Secondary | ICD-10-CM | POA: Diagnosis not present

## 2014-05-22 MED ORDER — NAPROXEN 500 MG PO TABS: 500.0000 mg | ORAL_TABLET | Freq: Two times a day (BID) | ORAL | Status: DC

## 2014-05-22 MED ORDER — KETOROLAC TROMETHAMINE 60 MG/2ML IM SOLN
60.0000 mg | Freq: Once | INTRAMUSCULAR | Status: AC
  Administered 2014-05-22: 60 mg via INTRAMUSCULAR
  Filled 2014-05-22: qty 2

## 2014-05-22 MED ORDER — DIAZEPAM 5 MG PO TABS
5.0000 mg | ORAL_TABLET | Freq: Once | ORAL | Status: AC
  Administered 2014-05-22: 5 mg via ORAL
  Filled 2014-05-22: qty 1

## 2014-05-22 NOTE — Discharge Instructions (Signed)
Arthralgia °Your caregiver has diagnosed you as suffering from an arthralgia. Arthralgia means there is pain in a joint. This can come from many reasons including: °· Bruising the joint which causes soreness (inflammation) in the joint. °· Wear and tear on the joints which occur as we grow older (osteoarthritis). °· Overusing the joint. °· Various forms of arthritis. °· Infections of the joint. °Regardless of the cause of pain in your joint, most of these different pains respond to anti-inflammatory drugs and rest. The exception to this is when a joint is infected, and these cases are treated with antibiotics, if it is a bacterial infection. °HOME CARE INSTRUCTIONS  °· Rest the injured area for as long as directed by your caregiver. Then slowly start using the joint as directed by your caregiver and as the pain allows. Crutches as directed may be useful if the ankles, knees or hips are involved. If the knee was splinted or casted, continue use and care as directed. If an stretchy or elastic wrapping bandage has been applied today, it should be removed and re-applied every 3 to 4 hours. It should not be applied tightly, but firmly enough to keep swelling down. Watch toes and feet for swelling, bluish discoloration, coldness, numbness or excessive pain. If any of these problems (symptoms) occur, remove the ace bandage and re-apply more loosely. If these symptoms persist, contact your caregiver or return to this location. °· For the first 24 hours, keep the injured extremity elevated on pillows while lying down. °· Apply ice for 15-20 minutes to the sore joint every couple hours while awake for the first half day. Then 03-04 times per day for the first 48 hours. Put the ice in a plastic bag and place a towel between the bag of ice and your skin. °· Wear any splinting, casting, elastic bandage applications, or slings as instructed. °· Only take over-the-counter or prescription medicines for pain, discomfort, or fever as  directed by your caregiver. Do not use aspirin immediately after the injury unless instructed by your physician. Aspirin can cause increased bleeding and bruising of the tissues. °· If you were given crutches, continue to use them as instructed and do not resume weight bearing on the sore joint until instructed. °Persistent pain and inability to use the sore joint as directed for more than 2 to 3 days are warning signs indicating that you should see a caregiver for a follow-up visit as soon as possible. Initially, a hairline fracture (break in bone) may not be evident on X-rays. Persistent pain and swelling indicate that further evaluation, non-weight bearing or use of the joint (use of crutches or slings as instructed), or further X-rays are indicated. X-rays may sometimes not show a small fracture until a week or 10 days later. Make a follow-up appointment with your own caregiver or one to whom we have referred you. A radiologist (specialist in reading X-rays) may read your X-rays. Make sure you know how you are to obtain your X-ray results. Do not assume everything is normal if you do not hear from us. °SEEK MEDICAL CARE IF: °Bruising, swelling, or pain increases. °SEEK IMMEDIATE MEDICAL CARE IF:  °· Your fingers or toes are numb or blue. °· The pain is not responding to medications and continues to stay the same or get worse. °· The pain in your joint becomes severe. °· You develop a fever over 102° F (38.9° C). °· It becomes impossible to move or use the joint. °MAKE SURE YOU:  °·   Understand these instructions.  Will watch your condition.  Will get help right away if you are not doing well or get worse. Document Released: 05/05/2005 Document Revised: 07/28/2011 Document Reviewed: 12/22/2007 [facility] Patient Information 2015 [place], [place]. This information is not intended to replace advice given to you by your health care provider. Make sure you discuss any questions you have with your health care  provider.   Please take your medications as prescribed. You may also utilize any Rice therapy packet attached at the end of your pronounced. Please follow-up with primary care for further evaluation management of your symptoms. Return to ED for worsening symptoms
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
## 2018-06-03 ENCOUNTER — Encounter (HOSPITAL_COMMUNITY): Payer: Self-pay

## 2018-06-03 ENCOUNTER — Emergency Department (HOSPITAL_COMMUNITY): Payer: Self-pay

## 2018-06-03 ENCOUNTER — Emergency Department (HOSPITAL_COMMUNITY)
Admission: EM | Admit: 2018-06-03 | Discharge: 2018-06-03 | Disposition: A | Discharge status: Home / Self Care | Payer: Self-pay | Attending: Emergency Medicine | Admitting: Emergency Medicine

## 2018-06-03 ENCOUNTER — Other Ambulatory Visit: Payer: Self-pay

## 2018-06-03 DIAGNOSIS — R079 Chest pain, unspecified: Secondary | ICD-10-CM | POA: Insufficient documentation

## 2018-06-03 DIAGNOSIS — F17210 Nicotine dependence, cigarettes, uncomplicated: Secondary | ICD-10-CM | POA: Insufficient documentation

## 2018-06-03 LAB — CBC
HEMATOCRIT: 36.6 % (ref 36.0–46.0)
Hemoglobin: 11.1 g/dL — ABNORMAL LOW (ref 12.0–15.0)
MCH: 24.7 pg — AB (ref 26.0–34.0)
MCHC: 30.3 g/dL (ref 30.0–36.0)
MCV: 81.5 fL (ref 80.0–100.0)
Platelets: 222 10*3/uL (ref 150–400)
RBC: 4.49 MIL/uL (ref 3.87–5.11)
RDW: 14.2 % (ref 11.5–15.5)
WBC: 7.8 10*3/uL (ref 4.0–10.5)
nRBC: 0 % (ref 0.0–0.2)

## 2018-06-03 LAB — BASIC METABOLIC PANEL
Anion gap: 9 (ref 5–15)
BUN: 7 mg/dL (ref 6–20)
CHLORIDE: 110 mmol/L (ref 98–111)
CO2: 23 mmol/L (ref 22–32)
Calcium: 9 mg/dL (ref 8.9–10.3)
GLUCOSE: 87 mg/dL (ref 70–99)
Potassium: 4.2 mmol/L (ref 3.5–5.1)
SODIUM: 142 mmol/L (ref 135–145)

## 2018-06-03 LAB — I-STAT TROPONIN, ED: Troponin i, poc: 0 ng/mL (ref 0.00–0.08)

## 2018-06-03 MED ORDER — SODIUM CHLORIDE 0.9% FLUSH
3.0000 mL | Freq: Once | INTRAVENOUS | Status: AC
  Administered 2018-06-03: 3 mL via INTRAVENOUS

## 2018-06-03 NOTE — ED Triage Notes (Signed)
Patient c/o intermittent mid and right chest pain x 1 week. Patient states started again last night and did not go away. Patient denies any SOB, nausea or SOB.

## 2018-06-03 NOTE — Discharge Instructions (Addendum)
You were seen in the emergency department for 2 episodes of chest pain.  Testing here did not show an obvious cause of your symptoms.  You were also concerned that you may have breast cancer but we did not see any obvious findings on exam.  You will need to get set up with a primary care doctor so they can arrange for you to get a mammogram.  Please return if any worsening symptoms.

## 2018-06-03 NOTE — ED Provider Notes (Signed)
[facility]-EMERGENCY DEPT Provider Note   CSN: [id number] Arrival date & time: 06/03/18  [id number]     History   Chief Complaint Chief Complaint  Patient presents with  . Chest Pain    HPI Melinda Davidson is a 48 y.o. female.  She is presenting here with complaint of 2 episodes of chest pain.  She said it happened about a week ago and then recurred again last night.  She said it was an ache in the center of her chest radiated to her right axilla.  She said she could make it go away by slowly taking some breaths.  She does not really feel short of breath and it was not associated with nausea or vomiting or diaphoresis.  No trauma.  She denies any drugs.  She is wondering if it is related to her breast but she has not noticed any masses or discharge or discoloration.  The history is provided by the patient.  Chest Pain  Pain location:  Substernal area Pain quality: aching   Pain radiates to:  R shoulder Pain severity:  Moderate Onset quality:  Sudden Timing:  Rare Progression:  Resolved Chronicity:  New Relieved by:  Certain positions Worsened by:  Nothing Ineffective treatments:  None tried Associated symptoms: no abdominal pain, no back pain, no cough, no diaphoresis, no fever, no headache, no heartburn, no lower extremity edema, no nausea, no palpitations, no shortness of breath, no syncope and no vomiting     Past Medical History:  Diagnosis Date  . No significant past medical history     Patient Active Problem List   Diagnosis Date Noted  . No significant past medical history     Past Surgical History:  Procedure Laterality Date  . UTERINE FIBROID SURGERY       OB History   No obstetric history on file.      Home Medications    Prior to Admission medications   Medication Sig Start Date End Date Taking? Authorizing Provider  aspirin-acetaminophen-caffeine (EXCEDRIN MIGRAINE) [phone] MG tablet Take 2 tablets by mouth every 6 (six) hours  as needed for headache.   Yes [provider]  naproxen (NAPROSYN) 500 MG tablet Take 1 tablet (500 mg total) by mouth 2 (two) times daily. Patient not taking: Reported on 06/03/2018 05/22/14   Joycie Peekartner, Benjamin, PA-C    Family History Family History  Problem Relation Age of Onset  . Healthy Mother     Social History Social History   Tobacco Use  . Smoking status: Current Every Day Smoker    Packs/day: 1.00    Types: Cigarettes  . Smokeless tobacco: Never Used  Substance Use Topics  . Alcohol use: Yes  . Drug use: No     Allergies   Patient has no known allergies.   Review of Systems Review of Systems  Constitutional: Negative for diaphoresis and fever.  HENT: Negative for sore throat.   Eyes: Negative for visual disturbance.  Respiratory: Negative for cough and shortness of breath.   Cardiovascular: Positive for chest pain. Negative for palpitations and syncope.  Gastrointestinal: Negative for abdominal pain, heartburn, nausea and vomiting.  Genitourinary: Negative for dysuria.  Musculoskeletal: Negative for back pain.  Skin: Negative for rash.  Neurological: Negative for headaches.     Physical Exam Updated Vital Signs BP 127/64 (BP Location: Left Arm)   Pulse 85   Temp 98.7 F (37.1 C)   Resp 17   Ht 5\' 4"  (1.626 m)  Wt 54.4 kg   SpO2 97%   BMI 20.60 kg/m   Physical Exam Vitals signs and nursing note reviewed. Exam conducted with a chaperone present.  Constitutional:      General: She is not in acute distress.    Appearance: She is well-developed.  HENT:     Head: Normocephalic and atraumatic.  Eyes:     Conjunctiva/sclera: Conjunctivae normal.  Neck:     Musculoskeletal: Neck supple.  Cardiovascular:     Rate and Rhythm: Normal rate and regular rhythm.     Heart sounds: No murmur.  Pulmonary:     Effort: Pulmonary effort is normal. No respiratory distress.     Breath sounds: Normal breath sounds.  Chest:     Chest wall: No mass or  tenderness.     Breasts:        Right: Normal. No bleeding, inverted nipple, mass, nipple discharge or skin change.   Abdominal:     Palpations: Abdomen is soft.     Tenderness: There is no abdominal tenderness.  Skin:    General: Skin is warm and dry.  Neurological:     Mental Status: She is alert.      ED Treatments / Results  Labs (all labs ordered are listed, but only abnormal results are displayed) Labs Reviewed  BASIC METABOLIC PANEL - Abnormal; Notable for the following components:      Result Value   Creatinine, Ser <0.30 (*)    All other components within normal limits  CBC - Abnormal; Notable for the following components:   Hemoglobin 11.1 (*)    MCH 24.7 (*)    All other components within normal limits  I-STAT TROPONIN, ED    EKG EKG Interpretation  Date/Time:  Thursday June 03 2018 08:30:56 EST Ventricular Rate:  79 PR Interval:    QRS Duration: 77 QT Interval:  393 QTC Calculation: 451 R Axis:   -3 Text Interpretation:  Sinus rhythm Anterior infarct, old similar to prior 4/11 Confirmed by Meridee Score [phone]) on 06/03/2018 9:28:04 AM   Radiology Dg Chest 2 View  Result Date: 06/03/2018 CLINICAL DATA:  Chest pain. EXAM: CHEST - 2 VIEW COMPARISON:  05/18/2014 FINDINGS: Both lungs are clear. Heart and mediastinum are within normal limits. Trachea is midline. No pleural effusions. Bone structures are unremarkable. IMPRESSION: No active cardiopulmonary disease. Electronically Signed   By: Richarda Overlie M.D.   On: 06/03/2018 08:50    Procedures Procedures (including critical care time)  Medications Ordered in ED Medications  sodium chloride flush (NS) 0.9 % injection 3 mL (3 mLs Intravenous Given 06/03/18 9563)     Initial Impression / Assessment and Plan / ED Course  I have reviewed the triage vital signs and the nursing notes.  Pertinent labs & imaging results that were available during my care of the patient were reviewed by me and considered in  my medical decision making (see chart for details).  Clinical Course as of Jun 03 1542  Thu Jun 03, 2018  1028 Patient with atypical chest pain that is resolved.  Her work-up is been unremarkable here including a negative troponin normal chest x-ray and unremarkable EKG.  I recommended her that she get set up with a primary care doctor and return if any worsening symptoms.   [MB]    Clinical Course User Index [MB] Terrilee Files, MD     Final Clinical Impressions(s) / ED Diagnoses   Final diagnoses:  Nonspecific chest pain  ED Discharge Orders    None       Terrilee FilesButler, Venkat Ankney C, MD 06/03/18 1544

## 2019-08-16 IMAGING — CR DG CHEST 2V
2 series · 2 of 2 positions shown · non-contrast
Comparison: 05/18/2014

CLINICAL DATA: Chest pain.

EXAM:
CHEST - 2 VIEW

[w chest pa]
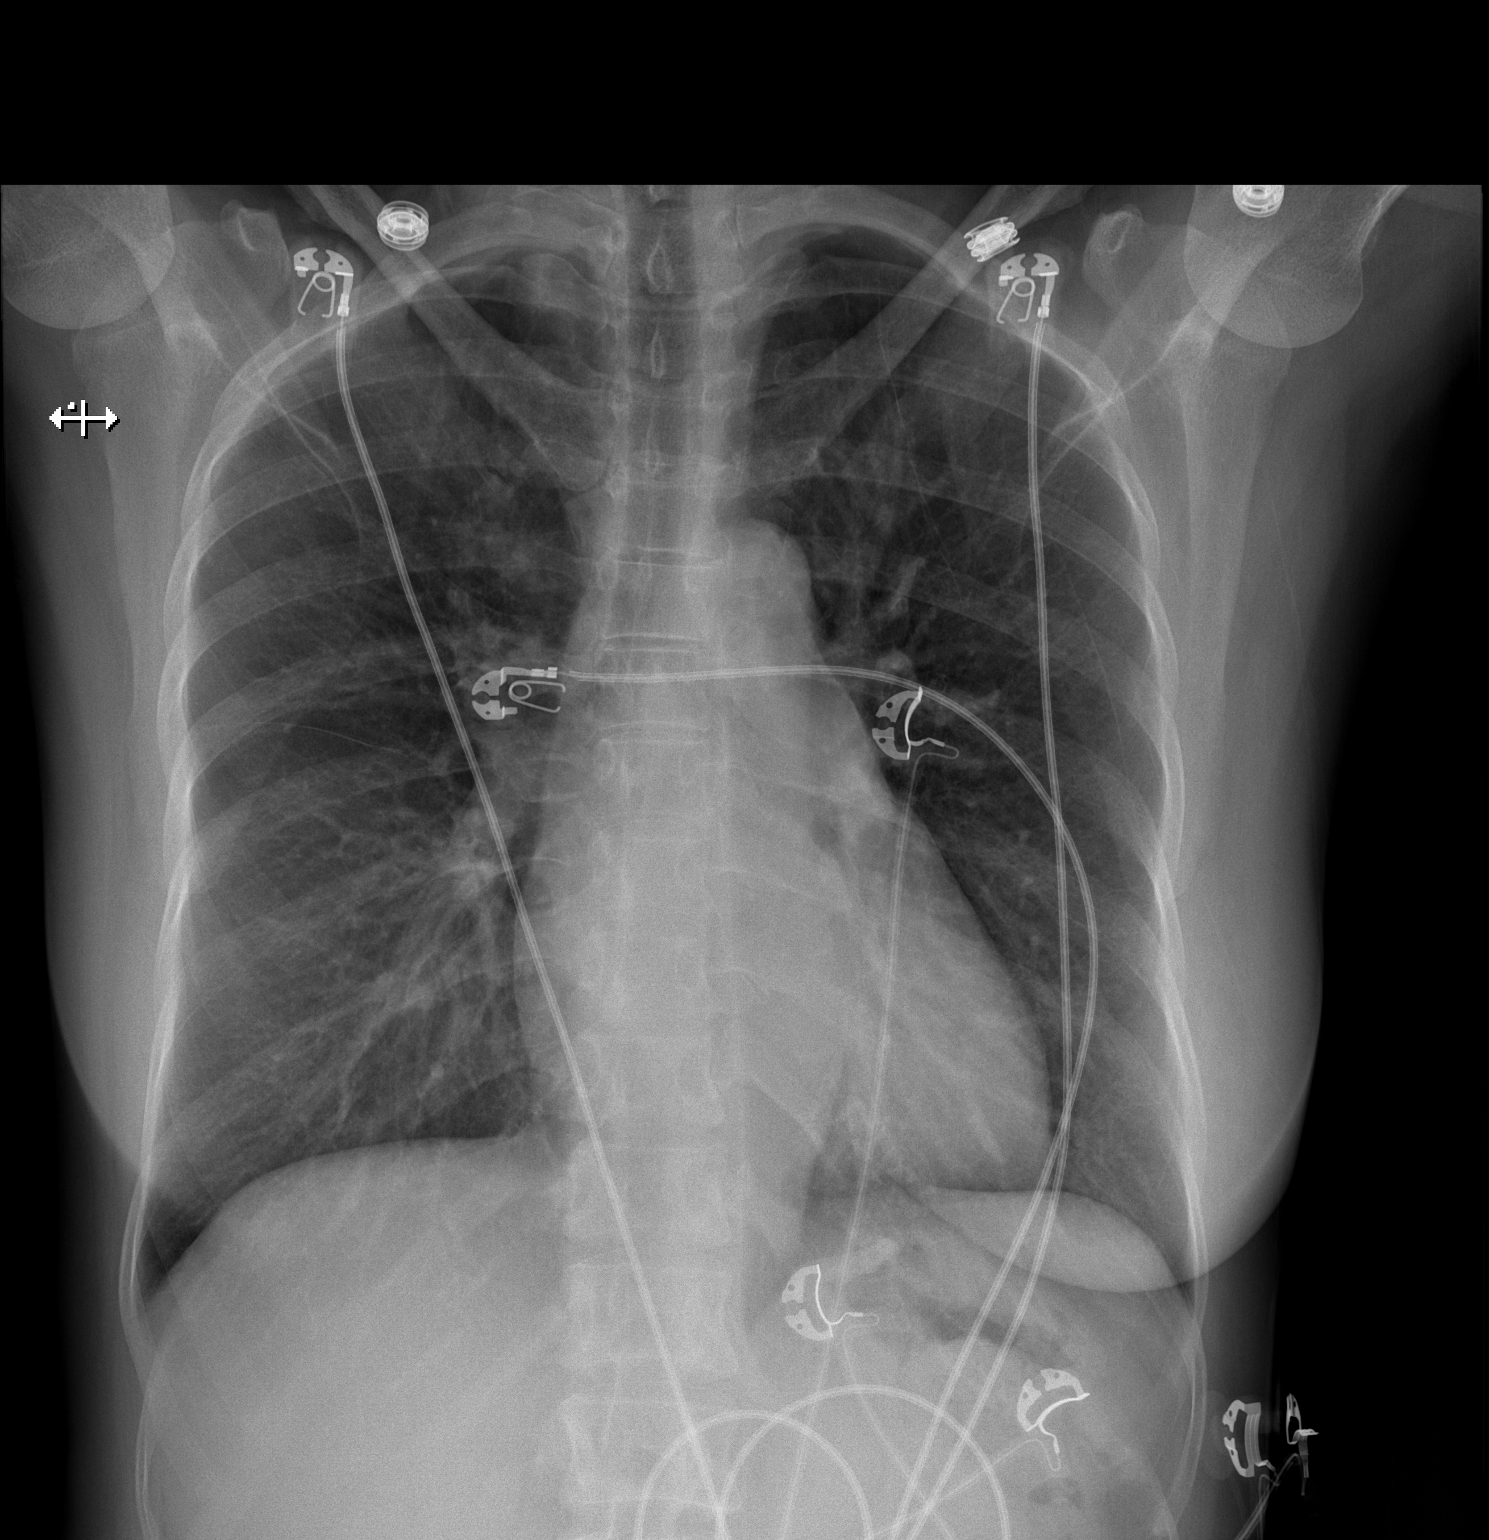

[w chest lat]
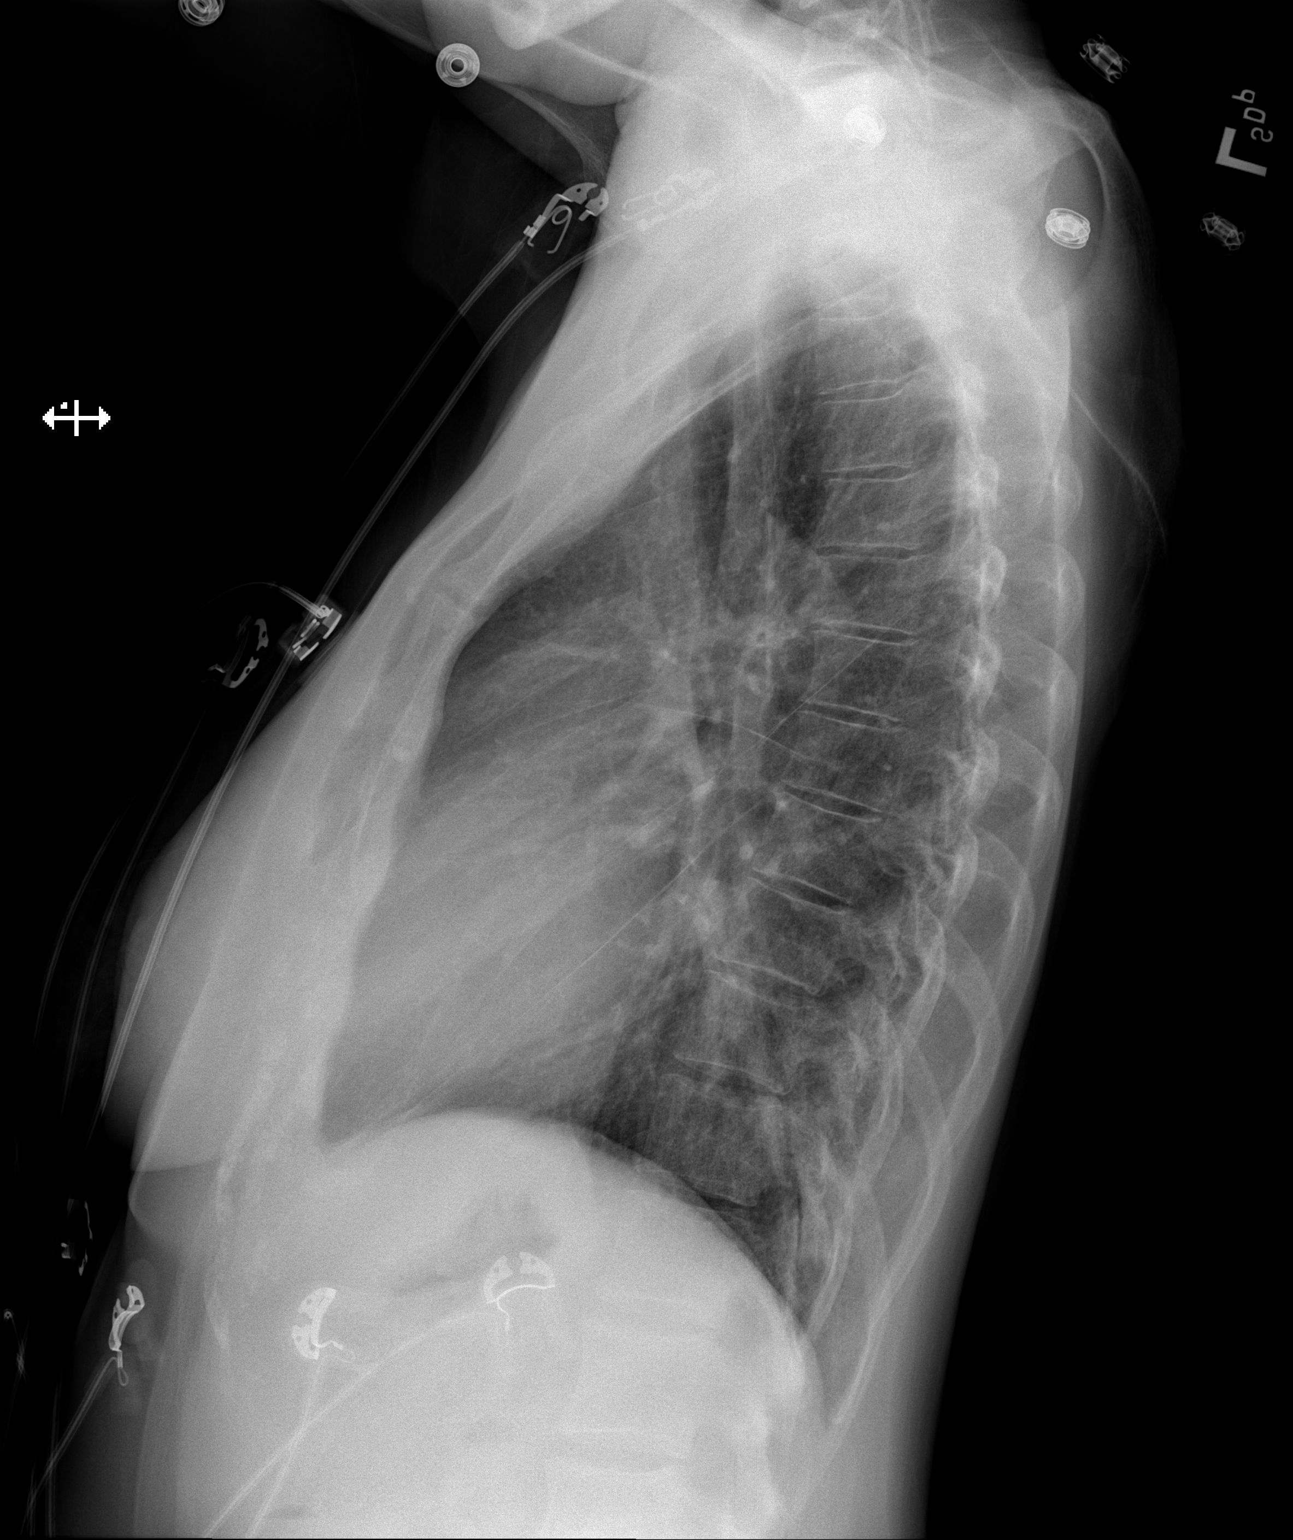

[2 of 2 positions shown; findings below may reference images not displayed]

FINDINGS: Both lungs are clear. Heart and mediastinum are within normal
limits. Trachea is midline. No pleural effusions. Bone structures
are unremarkable.
IMPRESSION: No active cardiopulmonary disease.

## 2020-02-24 ENCOUNTER — Other Ambulatory Visit: Payer: Self-pay

## 2020-02-24 ENCOUNTER — Telehealth: Payer: Self-pay

## 2020-02-24 DIAGNOSIS — N6320 Unspecified lump in the left breast, unspecified quadrant: Secondary | ICD-10-CM

## 2020-02-24 NOTE — Telephone Encounter (Signed)
Patient left message requesting regarding scheduling a mammogram. Left message on identifying voicemail requesting return call.

## 2020-03-07 ENCOUNTER — Ambulatory Visit: Payer: Self-pay

## 2020-03-08 ENCOUNTER — Telehealth: Payer: Self-pay

## 2020-03-08 ENCOUNTER — Other Ambulatory Visit: Payer: Self-pay

## 2020-03-08 NOTE — Telephone Encounter (Signed)
Left message for patient about rescheduling missed [facility] appointment. Left name and number for patient to call back.

## 2021-06-29 ENCOUNTER — Encounter (HOSPITAL_COMMUNITY): Payer: Self-pay

## 2021-06-29 ENCOUNTER — Emergency Department (HOSPITAL_COMMUNITY)
Admission: EM | Admit: 2021-06-29 | Discharge: 2021-06-29 | Disposition: A | Discharge status: Home / Self Care | Payer: 59 | Attending: Emergency Medicine | Admitting: Emergency Medicine

## 2021-06-29 DIAGNOSIS — M791 Myalgia, unspecified site: Secondary | ICD-10-CM | POA: Insufficient documentation

## 2021-06-29 DIAGNOSIS — Y92481 Parking lot as the place of occurrence of the external cause: Secondary | ICD-10-CM | POA: Diagnosis not present

## 2021-06-29 MED ORDER — CYCLOBENZAPRINE HCL 10 MG PO TABS: 10.0000 mg | ORAL_TABLET | Freq: Two times a day (BID) | ORAL | 0 refills | Status: AC | PRN

## 2021-06-29 MED ORDER — LIDOCAINE 5 % EX PTCH: 1.0000 | MEDICATED_PATCH | CUTANEOUS | 0 refills | Status: AC

## 2021-06-29 MED ORDER — NAPROXEN 500 MG PO TABS: 500.0000 mg | ORAL_TABLET | Freq: Two times a day (BID) | ORAL | 0 refills | Status: AC
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
## 2021-12-22 ENCOUNTER — Emergency Department (HOSPITAL_COMMUNITY): Payer: Commercial Managed Care - HMO

## 2021-12-22 ENCOUNTER — Encounter (HOSPITAL_COMMUNITY): Payer: Self-pay

## 2021-12-22 ENCOUNTER — Other Ambulatory Visit: Payer: Self-pay

## 2021-12-22 ENCOUNTER — Emergency Department (HOSPITAL_COMMUNITY)
Admission: EM | Admit: 2021-12-22 | Discharge: 2021-12-22 | Disposition: A | Discharge status: Home / Self Care | Payer: Commercial Managed Care - HMO | Attending: Emergency Medicine | Admitting: Emergency Medicine

## 2021-12-22 DIAGNOSIS — R079 Chest pain, unspecified: Secondary | ICD-10-CM

## 2021-12-22 DIAGNOSIS — R0789 Other chest pain: Secondary | ICD-10-CM | POA: Insufficient documentation

## 2021-12-22 DIAGNOSIS — F17200 Nicotine dependence, unspecified, uncomplicated: Secondary | ICD-10-CM | POA: Insufficient documentation

## 2021-12-22 DIAGNOSIS — R0602 Shortness of breath: Secondary | ICD-10-CM | POA: Insufficient documentation

## 2021-12-22 DIAGNOSIS — D649 Anemia, unspecified: Secondary | ICD-10-CM | POA: Insufficient documentation

## 2021-12-22 LAB — BASIC METABOLIC PANEL
Anion gap: 7 (ref 5–15)
BUN: 8 mg/dL (ref 6–20)
CO2: 25 mmol/L (ref 22–32)
Calcium: 8.8 mg/dL — ABNORMAL LOW (ref 8.9–10.3)
Chloride: 107 mmol/L (ref 98–111)
Creatinine, Ser: <0.3 mg/dL — ABNORMAL LOW (ref 0.44–1.00)
Glucose, Bld: 98 mg/dL (ref 70–99)
Potassium: 3.9 mmol/L (ref 3.5–5.1)
Sodium: 139 mmol/L (ref 135–145)

## 2021-12-22 LAB — CBC
HCT: 36.1 % (ref 36.0–46.0)
Hemoglobin: 11.6 g/dL — ABNORMAL LOW (ref 12.0–15.0)
MCH: 25.8 pg — ABNORMAL LOW (ref 26.0–34.0)
MCHC: 32.1 g/dL (ref 30.0–36.0)
MCV: 80.4 fL (ref 80.0–100.0)
Platelets: 293 10*3/uL (ref 150–400)
RBC: 4.49 MIL/uL (ref 3.87–5.11)
RDW: 12.9 % (ref 11.5–15.5)
WBC: 9.7 10*3/uL (ref 4.0–10.5)
nRBC: 0 % (ref 0.0–0.2)

## 2021-12-22 LAB — TROPONIN I (HIGH SENSITIVITY)
Troponin I (High Sensitivity): 5 ng/L (ref <18)
Troponin I (High Sensitivity): 5 ng/L (ref <18)

## 2021-12-22 MED ORDER — NITROGLYCERIN 0.4 MG SL SUBL: 0.4000 mg | SUBLINGUAL_TABLET | SUBLINGUAL | Status: DC | PRN

## 2021-12-22 MED ORDER — MORPHINE SULFATE (PF) 2 MG/ML IV SOLN
2.0000 mg | Freq: Once | INTRAVENOUS | Status: AC
  Administered 2021-12-22: 2 mg via INTRAVENOUS
  Filled 2021-12-22: qty 1

## 2021-12-22 NOTE — Discharge Instructions (Signed)
Please use Tylenol or ibuprofen for pain.  You may use 600 mg ibuprofen every 6 hours or 1000 mg of Tylenol every 6 hours.  You may choose to alternate between the 2.  This would be most effective.  Not to exceed 4 g of Tylenol within 24 hours.  Not to exceed 3200 mg ibuprofen 24 hours.  You can call the [place] community wellness number that I provided above to help to establish a primary care doctor, I recommend that you use ibuprofen, Tylenol as needed for any lingering pain, if you have worsening chest pain, shortness of breath however please return to the emergency department for further evaluation.  I have also given you the name of a cardiologist to call to establish care especially if you continue to have chest pain.  It was pleasure taking care of you today, I hope that you feel better soon.

## 2021-12-22 NOTE — ED Provider Notes (Signed)
[facility] DEPT Provider Note   CSN: [id number] Arrival date & time: 12/22/21  1408     History  Chief Complaint  Patient presents with   Chest Pain   Shortness of Breath    Melinda Davidson is a 51 y.o. female contributory past medical history presents with concern for chest pain since this morning after waking up.  She reports that it started on the left side around her breast radiates over to the right side.  She endorses shortness of breath, reports the chest pain is worse with exertion.  She describes it as a pressure/aching/heaviness.  She denies nausea, vomiting, diaphoresis.  Denies family history of early cardiac death, previous history of ACS, stroke.  She reports that she smokes a pack a day.  Reports that she has been sleeping up in a recliner recently, denies any recent exertion or injury to arms, or other heavy activity.  She reports that nothing seems to make the chest pain better since this morning.  Patient denies feeling of heart racing.   Chest Pain Associated symptoms: shortness of breath   Shortness of Breath Associated symptoms: chest pain        Home Medications Prior to Admission medications   Medication Sig Start Date End Date Taking? Authorizing Provider  ibuprofen (ADVIL) 200 MG tablet Take 200 mg by mouth every 6 (six) hours as needed for moderate pain.   Yes [provider]  naproxen sodium (ALEVE) 220 MG tablet Take 220 mg by mouth daily as needed (pain).   Yes [provider]  cyclobenzaprine (FLEXERIL) 10 MG tablet Take 1 tablet (10 mg total) by mouth 2 (two) times daily as needed for muscle spasms. Patient not taking: Reported on 12/22/2021 06/29/21   Redwine, Madison A, PA-C  lidocaine (LIDODERM) 5 % Place 1 patch onto the skin daily. Remove & Discard patch within 12 hours or as directed by MD Patient not taking: Reported on 12/22/2021 06/29/21   Redwine, Madison A, PA-C  naproxen (NAPROSYN) 500 MG tablet  Take 1 tablet (500 mg total) by mouth 2 (two) times daily. Patient not taking: Reported on 12/22/2021 06/29/21   Redwine, Madison A, PA-C      Allergies    Patient has no known allergies.    Review of Systems   Review of Systems  Respiratory:  Positive for shortness of breath.   Cardiovascular:  Positive for chest pain.  All other systems reviewed and are negative.   Physical Exam Updated Vital Signs BP (!) 116/59 (BP Location: Right Arm)   Pulse 87   Temp 97.8 F (36.6 C) (Oral)   Resp (!) 22   Ht 5\' 3"  (1.6 m)   Wt 54.4 kg   SpO2 99%   BMI 21.26 kg/m  Physical Exam Vitals and nursing note reviewed.  Constitutional:      General: She is not in acute distress.    Appearance: Normal appearance.  HENT:     Head: Normocephalic and atraumatic.  Eyes:     General:        Right eye: No discharge.        Left eye: No discharge.  Cardiovascular:     Rate and Rhythm: Normal rate and regular rhythm.     Heart sounds: No murmur heard.    No friction rub. No gallop.  Pulmonary:     Effort: Pulmonary effort is normal.     Breath sounds: Normal breath sounds.  Chest:  Comments: Tenderness to palpation of chest wall, left greater than right. Abdominal:     General: Bowel sounds are normal.     Palpations: Abdomen is soft.  Skin:    General: Skin is warm and dry.     Capillary Refill: Capillary refill takes less than 2 seconds.  Neurological:     Mental Status: She is alert and oriented to person, place, and time.  Psychiatric:        Mood and Affect: Mood normal.        Behavior: Behavior normal.     ED Results / Procedures / Treatments   Labs (all labs ordered are listed, but only abnormal results are displayed) Labs Reviewed  BASIC METABOLIC PANEL - Abnormal; Notable for the following components:      Result Value   Creatinine, Ser <0.30 (*)    Calcium 8.8 (*)    All other components within normal limits  CBC - Abnormal; Notable for the following components:    Hemoglobin 11.6 (*)    MCH 25.8 (*)    All other components within normal limits  PREGNANCY, URINE  TROPONIN I (HIGH SENSITIVITY)  TROPONIN I (HIGH SENSITIVITY)    EKG None  Radiology DG Chest 2 View  Result Date: 12/22/2021 CLINICAL DATA:  Chest pain, shortness of breath EXAM: CHEST - 2 VIEW COMPARISON:  06/03/2018 FINDINGS: Frontal and lateral views of the chest demonstrate an unremarkable cardiac silhouette. No airspace disease, effusion, or pneumothorax. No acute bony abnormality. IMPRESSION: 1. No acute intrathoracic process. Electronically Signed   By: Sharlet Salina M.D.   On: 12/22/2021 15:22    Procedures Procedures    Medications Ordered in ED Medications  nitroGLYCERIN (NITROSTAT) SL tablet 0.4 mg (has no administration in time range)  morphine (PF) 2 MG/ML injection 2 mg (2 mg Intravenous Given 12/22/21 1505)    ED Course/ Medical Decision Making/ A&P                           Medical Decision Making Amount and/or Complexity of Data Reviewed Labs: ordered. Radiology: ordered.  Risk Prescription drug management.   This patient is a 51 y.o. female who presents to the ED for concern of chest pain, this involves an extensive number of treatment options, and is a complaint that carries with it a high risk of complications and morbidity. The emergent differential diagnosis prior to evaluation includes, but is not limited to,  ACS, AAS, PE, Mallory-Weiss, Boerhaave's, Pneumonia, acute bronchitis, asthma or COPD exacerbation, anxiety, MSK pain or traumatic injury to the chest, acid reflux versus other .   This is not an exhaustive differential.   Past Medical History / Co-morbidities / Social History: No noted history of hypertension, hyperlipidemia, obesity, strong family history of heart disease, previous ACS, stroke, patient does endorse 1 pack/day smoking.  Additional history: Chart reviewed. Pertinent results include: Reviewed lab work, imaging from recent previous  emergency department visits  Physical Exam: Physical exam performed. The pertinent findings include: Patient with chest pain that appears replicable with palpation, improved with rest and some medication.  She is in no distress, no accessory lung or heart sounds noted.  Lab Tests: I ordered, and personally interpreted labs.  The pertinent results include: Negative troponin x2 in context of resolved chest pain that was present for greater than 6 hours prior to arrival.  CBC overall unremarkable, mild anemia with hemoglobin 11.6.  BMP overall unremarkable.   Imaging Studies:  I ordered imaging studies including plain film chest x-ray. I independently visualized and interpreted imaging which showed no intrathoracic abnormality. I agree with the radiologist interpretation.   Cardiac Monitoring:  The patient was maintained on a cardiac monitor.  My attending physician Dr. Stevie Kern viewed and interpreted the cardiac monitored which showed an underlying rhythm of: Normal sinus rhythm, no ischemic changes noted. I agree with this interpretation.   Medications: I ordered medication including morphine for pain. Reevaluation of the patient after these medicines showed that the patient resolved. I have reviewed the patients home medicines and have made adjustments as needed.  Patient declined sublingual nitroglycerin as her chest pain was fully resolved prior to administration.   Disposition: After consideration of the diagnostic results and the patients response to treatment, I feel that she has nonspecific chest pain does not seem cardiac in nature.  She has heart score of 2 which does not suggest that she needs further work-up at this time, encouraged follow-up with PCP, cardiology.  Encouraged strict return for any ongoing chest pain, shortness of breath, fever, chills, diaphoresis, nausea or vomiting.  emergency department workup does not suggest an emergent condition requiring admission or immediate  intervention beyond what has been performed at this time. The plan is: as above. The patient is safe for discharge and has been instructed to return immediately for worsening symptoms, change in symptoms or any other concerns.  I discussed this case with my attending physician Dr. Stevie Kern who cosigned this note including patient's presenting symptoms, physical exam, and planned diagnostics and interventions. Attending physician stated agreement with plan or made changes to plan which were implemented.    Final Clinical Impression(s) / ED Diagnoses Final diagnoses:  Chest pain, unspecified type    Rx / DC Orders ED Discharge Orders     None         [place] 12/22/21 1946    Milagros Loll, MD 12/23/21 [phone]

## 2021-12-22 NOTE — ED Triage Notes (Signed)
Pt c/o constant chest pain after waking up this morning. Reports some associated SOB.

## 2023-08-19 ENCOUNTER — Encounter: Payer: Self-pay | Admitting: Internal Medicine

## 2023-08-31 ENCOUNTER — Encounter

## 2023-09-16 ENCOUNTER — Encounter: Admitting: Internal Medicine

## 2023-10-28 ENCOUNTER — Emergency Department (HOSPITAL_COMMUNITY)
Admission: EM | Admit: 2023-10-28 | Discharge: 2023-10-28 | Disposition: A | Discharge status: Home / Self Care | Attending: Emergency Medicine | Admitting: Emergency Medicine

## 2023-10-28 ENCOUNTER — Encounter (HOSPITAL_COMMUNITY): Payer: Self-pay

## 2023-10-28 ENCOUNTER — Emergency Department (HOSPITAL_COMMUNITY)

## 2023-10-28 ENCOUNTER — Other Ambulatory Visit: Payer: Self-pay

## 2023-10-28 DIAGNOSIS — M25511 Pain in right shoulder: Secondary | ICD-10-CM | POA: Diagnosis not present

## 2023-10-28 DIAGNOSIS — R937 Abnormal findings on diagnostic imaging of other parts of musculoskeletal system: Secondary | ICD-10-CM | POA: Insufficient documentation

## 2023-10-28 DIAGNOSIS — R0781 Pleurodynia: Secondary | ICD-10-CM | POA: Insufficient documentation

## 2023-10-28 DIAGNOSIS — R519 Headache, unspecified: Secondary | ICD-10-CM | POA: Diagnosis present

## 2023-10-28 DIAGNOSIS — M542 Cervicalgia: Secondary | ICD-10-CM | POA: Insufficient documentation

## 2023-10-28 DIAGNOSIS — R9389 Abnormal findings on diagnostic imaging of other specified body structures: Secondary | ICD-10-CM

## 2023-10-28 HISTORY — DX: Hypothyroidism, unspecified: E03.9

## 2023-10-28 MED ORDER — METHOCARBAMOL 500 MG PO TABS
500.0000 mg | ORAL_TABLET | Freq: Once | ORAL | Status: AC
Start: 2023-10-28 — End: 2023-10-28
  Administered 2023-10-28: 500 mg via ORAL
  Filled 2023-10-28: qty 1

## 2023-10-28 MED ORDER — ACETAMINOPHEN 325 MG PO TABS
650.0000 mg | ORAL_TABLET | Freq: Once | ORAL | Status: AC
Start: 2023-10-28 — End: 2023-10-28
  Administered 2023-10-28: 650 mg via ORAL
  Filled 2023-10-28: qty 2

## 2023-10-28 MED ORDER — METHOCARBAMOL 500 MG PO TABS: 500.0000 mg | ORAL_TABLET | Freq: Two times a day (BID) | ORAL | 0 refills | Status: AC
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
## 2024-05-26 ENCOUNTER — Other Ambulatory Visit: Payer: Self-pay

## 2024-05-26 ENCOUNTER — Emergency Department (HOSPITAL_COMMUNITY): Payer: Self-pay

## 2024-05-26 ENCOUNTER — Encounter (HOSPITAL_COMMUNITY): Payer: Self-pay | Admitting: Emergency Medicine

## 2024-05-26 ENCOUNTER — Emergency Department (HOSPITAL_COMMUNITY)
Admission: EM | Admit: 2024-05-26 | Discharge: 2024-05-26 | Disposition: A | Discharge status: Home / Self Care | Payer: Self-pay | Attending: Emergency Medicine | Admitting: Emergency Medicine

## 2024-05-26 DIAGNOSIS — J069 Acute upper respiratory infection, unspecified: Secondary | ICD-10-CM | POA: Insufficient documentation

## 2024-05-26 DIAGNOSIS — R059 Cough, unspecified: Secondary | ICD-10-CM | POA: Diagnosis present

## 2024-05-26 LAB — GROUP A STREP BY PCR: Group A Strep by PCR: NOT DETECTED

## 2024-05-26 MED ORDER — DOXYCYCLINE HYCLATE 100 MG PO CAPS: 100.0000 mg | ORAL_CAPSULE | Freq: Two times a day (BID) | ORAL | 0 refills | Status: DC

## 2024-05-26 MED ORDER — DOXYCYCLINE HYCLATE 100 MG PO CAPS: 100.0000 mg | ORAL_CAPSULE | Freq: Two times a day (BID) | ORAL | 0 refills | Status: AC

## 2024-05-26 MED ORDER — BENZONATATE 100 MG PO CAPS: 100.0000 mg | ORAL_CAPSULE | Freq: Three times a day (TID) | ORAL | 0 refills | Status: DC

## 2024-05-26 MED ORDER — BENZONATATE 100 MG PO CAPS: 100.0000 mg | ORAL_CAPSULE | Freq: Three times a day (TID) | ORAL | 0 refills | Status: AC

## 2024-05-26 NOTE — ED Triage Notes (Signed)
 Patient c/o cough and sore throat x 2 weeks. Patient report a family member was recently was positive for flu. Patient denies chest pain and SOB.

## 2024-05-26 NOTE — Discharge Instructions (Signed)
 Take doxycycline  2 times per day for 10 days. Take Tessalon  2-3x per day for cough. Return to the ED for fever, difficulty breathing.

## 2024-05-26 NOTE — ED Provider Notes (Signed)
 " [place] EMERGENCY DEPARTMENT AT [facility] Provider Note  CSN: [id number] Arrival date & time: 05/26/24 0154  Chief Complaint(s) Sore Throat and Cough  HPI Melinda Davidson is a 54 y.o. female who is here today for worsening cough in the setting of likely influenza.  Patient reports on Christmas Day she started to develop influenza symptoms, sore throat, cough, myalgias.  She states that her symptoms have all improved except her cough seems to be worsening.  She has been coughing near constantly, has been unable to sleep.  Has not had fever.   Past Medical History Past Medical History:  Diagnosis Date   No significant past medical history    Thyroid activity decreased    Patient Active Problem List   Diagnosis Date Noted   No significant past medical history    Home Medication(s) Prior to Admission medications  Medication Sig Start Date End Date Taking? Authorizing Provider  benzonatate  (TESSALON ) 100 MG capsule Take 1 capsule (100 mg total) by mouth every 8 (eight) hours. 05/26/24  Yes Mannie Pac T, DO  doxycycline  (VIBRAMYCIN ) 100 MG capsule Take 1 capsule (100 mg total) by mouth 2 (two) times daily. 05/26/24  Yes Mannie Pac T, DO  cyclobenzaprine  (FLEXERIL ) 10 MG tablet Take 1 tablet (10 mg total) by mouth 2 (two) times daily as needed for muscle spasms. Patient not taking: Reported on 12/22/2021 06/29/21   Redwine, Madison A, PA-C  ibuprofen (ADVIL) 200 MG tablet Take 200 mg by mouth every 6 (six) hours as needed for moderate pain.    [provider]  lidocaine  (LIDODERM ) 5 % Place 1 patch onto the skin daily. Remove & Discard patch within 12 hours or as directed by MD Patient not taking: Reported on 12/22/2021 06/29/21   Redwine, Madison A, PA-C  methocarbamol  (ROBAXIN ) 500 MG tablet Take 1 tablet (500 mg total) by mouth 2 (two) times daily. 10/28/23   Smoot, Lauraine LABOR, PA-C  naproxen  (NAPROSYN ) 500 MG tablet Take 1 tablet (500 mg total) by mouth 2 (two)  times daily. Patient not taking: Reported on 12/22/2021 06/29/21   Redwine, Madison A, PA-C  naproxen  sodium (ALEVE ) 220 MG tablet Take 220 mg by mouth daily as needed (pain).    [provider]                                                                                                                                    Past Surgical History Past Surgical History:  Procedure Laterality Date   UTERINE FIBROID SURGERY     Family History Family History  Problem Relation Age of Onset   Healthy Mother     Social History Social History[1] Allergies Patient has no known allergies.  Review of Systems Review of Systems  Physical Exam Vital Signs  I have reviewed the triage vital signs BP 122/71 (BP Location: Left Arm)   Pulse 98   Temp ROLLEN)  100.7 F (38.2 C) (Oral)   Resp 17   SpO2 97%   Physical Exam Vitals and nursing note reviewed.  Constitutional:      Appearance: She is not toxic-appearing.  HENT:     Head: Normocephalic.     Mouth/Throat:     Mouth: Mucous membranes are moist.     Comments: Uvula midline, no swelling of the oropharynx, no exudates. Eyes:     Pupils: Pupils are equal, round, and reactive to light.  Cardiovascular:     Rate and Rhythm: Regular rhythm. Tachycardia present.  Pulmonary:     Effort: Pulmonary effort is normal. No respiratory distress.     Breath sounds: No stridor. No rales.  Abdominal:     Palpations: Abdomen is soft.  Neurological:     Mental Status: She is alert.     ED Results and Treatments Labs (all labs ordered are listed, but only abnormal results are displayed) Labs Reviewed  GROUP A STREP BY PCR                                                                                                                          Radiology DG Chest 2 View Result Date: 05/26/2024 EXAM: 2 VIEW(S) XRAY OF THE CHEST 05/26/2024 02:34:00 AM COMPARISON: 10/28/2023 CLINICAL HISTORY: Cough FINDINGS: LUNGS AND PLEURA: Mild  peribronchial thickening and interstitial prominence. No pleural effusion. No pneumothorax. HEART AND MEDIASTINUM: No acute abnormality of the cardiac and mediastinal silhouettes. BONES AND SOFT TISSUES: No acute osseous abnormality. IMPRESSION: 1. Mild peribronchial thickening and interstitial prominence which could reflect bronchitis or atypical infection. . Electronically signed by: Franky Crease MD MD 05/26/2024 02:45 AM EST RP Workstation: [id number]    Pertinent labs & imaging results that were available during my care of the patient were reviewed by me and considered in my medical decision making (see MDM for details).  Medications Ordered in ED Medications - No data to display                                                                                                                                   Procedures Procedures  (including critical care time)  Medical Decision Making / ED Course   This patient presents to the ED for concern of cough, this involves an extensive number of treatment options, and is a complaint that carries with it a high risk of complications  and morbidity.  The differential diagnosis includes postviral cough, pneumonia, walking pneumonia, bronchitis.  MDM: Patient's symptoms worsening, chest x-ray showing bronchitis versus atypical infection.  Presentation consistent with postviral cough versus post influenza pneumonia.  Given the patient's symptoms seem to be worsening rather than gradually improving, will empirically treat.  Prescription for doxycycline  sent.  Advised patient's likely would take several days before she would notice significant improvement.  Return precautions discussed with patient at bedside.   Additional history obtained:  -External records from outside source obtained and reviewed including: Chart review including previous notes, labs, imaging, consultation notes   Lab Tests: -I ordered, reviewed, and interpreted labs.   The  pertinent results include:   Labs Reviewed  GROUP A STREP BY PCR     Imaging Studies ordered: I ordered imaging studies including chest x-ray I independently visualized and interpreted imaging. I agree with the radiologist interpretation   Medicines ordered and prescription drug management: Meds ordered this encounter  Medications   doxycycline  (VIBRAMYCIN ) 100 MG capsule    Sig: Take 1 capsule (100 mg total) by mouth 2 (two) times daily.    Dispense:  20 capsule    Refill:  0   benzonatate  (TESSALON ) 100 MG capsule    Sig: Take 1 capsule (100 mg total) by mouth every 8 (eight) hours.    Dispense:  21 capsule    Refill:  0    -I have reviewed the patients home medicines and have made adjustments as needed    Cardiac Monitoring: The patient was maintained on a cardiac monitor.  I personally viewed and interpreted the cardiac monitored which showed an underlying rhythm of: Normal sinus rhythm  Social Determinants of Health:  Factors impacting patients care include: Lack of access to primary care   Reevaluation: After the interventions noted above, I reevaluated the patient and found that they have :improved  Co morbidities that complicate the patient evaluation  Past Medical History:  Diagnosis Date   No significant past medical history    Thyroid activity decreased       Dispostion: I considered admission for this patient, however given overall reassuring physical exam, vital signs she is appropriate for outpatient management.     Final Clinical Impression(s) / ED Diagnoses Final diagnoses:  Upper respiratory tract infection, unspecified type     @PCDICTATION @     [1]  Social History Tobacco Use   Smoking status: Every Day    Current packs/day: 1.00    Types: Cigarettes   Smokeless tobacco: Never  Vaping Use   Vaping status: Never Used  Substance Use Topics   Alcohol use: Yes   Drug use: No     Mannie Pac T, DO 05/26/24 0705  "
